# Patient Record
Sex: Male | Born: 1970 | Race: White | Hispanic: No | Marital: Married | Smoking: Never smoker
Health system: Southern US, Community
[De-identification: ages and names within clinical notes are randomized; demographics above are authoritative.]

## PROBLEM LIST (undated history)

## (undated) DIAGNOSIS — E119 Type 2 diabetes mellitus without complications: Secondary | ICD-10-CM

## (undated) DIAGNOSIS — E785 Hyperlipidemia, unspecified: Secondary | ICD-10-CM

## (undated) DIAGNOSIS — E039 Hypothyroidism, unspecified: Secondary | ICD-10-CM

## (undated) HISTORY — DX: Type 2 diabetes mellitus without complications: E11.9

## (undated) HISTORY — DX: Hyperlipidemia, unspecified: E78.5

## (undated) HISTORY — DX: Hypothyroidism, unspecified: E03.9

---

## 2005-08-29 ENCOUNTER — Inpatient Hospital Stay (HOSPITAL_COMMUNITY): Admission: EM | Admit: 2005-08-29 | Discharge: 2005-08-31 | Payer: Self-pay | Admitting: Emergency Medicine

## 2006-08-24 ENCOUNTER — Inpatient Hospital Stay (HOSPITAL_COMMUNITY): Admission: EM | Admit: 2006-08-24 | Discharge: 2006-08-26 | Payer: Self-pay | Admitting: Emergency Medicine

## 2007-08-04 ENCOUNTER — Inpatient Hospital Stay (HOSPITAL_COMMUNITY): Admission: EM | Admit: 2007-08-04 | Discharge: 2007-08-09 | Payer: Self-pay | Admitting: Emergency Medicine

## 2007-08-04 ENCOUNTER — Ambulatory Visit: Payer: Self-pay | Admitting: Cardiology

## 2008-01-11 ENCOUNTER — Emergency Department (HOSPITAL_COMMUNITY): Admission: EM | Admit: 2008-01-11 | Discharge: 2008-01-12 | Payer: Self-pay | Admitting: Emergency Medicine

## 2010-12-10 NOTE — Group Therapy Note (Signed)
[name], [name]               ACCOUNT NO.:  [id number]   MEDICAL RECORD NO.:  [id number]          PATIENT TYPE:  INP   LOCATION:  IC08                          FACILITY:  [facility]   PHYSICIAN:  Dorris Singh, DO    DATE OF BIRTH:  09/29/1970   DATE OF PROCEDURE:  08/06/2007  DATE OF DISCHARGE:                                 PROGRESS NOTE   The patient seen today resting comfortably.  Still currently is on a  dopamine drip and his blood pressure is remaining around 100.  Will go  ahead and try to titrate him down today and see how he responds.  Also,  the patient has been NPO since admission.  Will go ahead and start a  clear liquid diet and increase as tolerated.  The patient denies any  complaints.  States he feels a little bit better than he did yesterday.  Still has bouts of nausea, but is improving slowly.   VITAL SIGNS:  Heart rate 78, blood pressure 97/52, and satting 100%.  GENERAL:  This is a 40 year old Caucasian male who is well-developed,  well-nourished in no acute distress.  He is able to answer questions  appropriately.  HEART:  Regular rate and rhythm.  No murmurs noted.  LUNGS:  Clear to auscultation bilaterally.  ABDOMEN:  Soft and nontender, nondistended.  EXTREMITIES:  Positive pulses.  No ecchymosis or edema.   He had a chest x-ray done on the 8th that showed no change from previous  exam and no evidence of pulmonary edema.  His labs are as follows.  He  has a white count of 11.4, hemoglobin 11.1, hematocrit 32.4, platelet  count 218,000.  INR is .4.  His chemistries, sodium 134, potassium 3.9,  chloride 105, CO2 21, glucose 216, BUN 10, and creatinine 1.2, calcium  4.1, and his magnesium was 1.2.   ASSESSMENT AND PLAN:  1. Acute dehydration.  The patient is improving slowly.  Will continue      with fluid hydration.  2. Acute hypotension.  The patient currently on a dopamine drip.  Will      try to wean him down today, as noted above.  3. Acute renal  insufficiency.  His renal function has improved,      possibly to dehydration.  Will continue to re-hydrate him with IV      fluids.  The patient is currently on a sepsis protocol.  He has      remained stable.  Will go ahead and continue with those orders as      appropriate.  4. Diabetic ketoacidosis.  His blood sugars have been controlled with      sliding scale.  He is on a D5 1/2 normal saline drip.  Will go      ahead and switch him over to normal saline at this point in time.      Also will allow him to start to eat clear liquids and advance his      diet as tolerated.  5. Gastroenteritis.  He still has some effects of some nausea, but we  will go ahead and continue to monitor that and treat it as needed.  6. Hyperkalemia has been resolved as well.  Will continue to monitor      the patient and make changes as needed.      Dorris Singh, DO  Electronically Signed     CB/MEDQ  D:  08/06/2007  T:  08/06/2007  Job:  [id number]

## 2010-12-10 NOTE — Op Note (Signed)
NAMEKAHLEN, MORAIS               ACCOUNT NO.:  [id number]   MEDICAL RECORD NO.:  [id number]          PATIENT TYPE:  INP   LOCATION:  IC08                          FACILITY:  [facility]   PHYSICIAN:  Tilford Pillar, MD      DATE OF BIRTH:  [birth year]   DATE OF PROCEDURE:  08/04/2007  DATE OF DISCHARGE:                               OPERATIVE REPORT   PREOPERATIVE DIAGNOSIS:  Sepsis.   POSTOPERATIVE DIAGNOSIS:  Sepsis.   OPERATION PERFORMED:  Left subclavian vein triple lumen catheter  placement.   SURGEON:  Tilford Pillar, MD   ANESTHESIA:  1% lidocaine local anesthetic.   ESTIMATED BLOOD LOSS:  Minimal.   INDICATIONS FOR PROCEDURE:  The patient is a 40 year old type 1 diabetic  who presented to [facility] this morning with nausea and  vomiting and decompensation.  At this point he was admitted and  transferred to the [facility] intensive care unit with ongoing suspicion  sepsis.  Plans were __________  sepsis protocol and central venous  pressure monitoring.  The procedure was discussed with the patient.  Risks, benefits and alternatives were also discussed and the patient  consented for planned procedure.   DESCRIPTION OF PROCEDURE:  The patient was placed in Trendelenburg  position in his ICU bed at which time his left neck and chest was  prepped with chlorhexidine solution.  Sterile drapes were then placed.  Using sterile technique 1% lidocaine was injected and __________ course  of venipuncture.  An 18 gauge introducer needle was then utilized to  identify the left subclavian vein.  Good venous return was obtained.  A  J-wire was advanced in standard Seldinger technique.  A triple lumen  catheter was advanced on the wire to 20 cm.  This was sutured secure to  the skin with 2-0 silks.  All three ports flushed and aspirated without  difficulty.  At this point 20 mL of blood was aspirated from the distal  port for laboratory study and analysis.  The line was then  flushed.  A  sterile dressing was placed and Tegaderm dressing.  Drapes removed and  the patient allowed to come out of Trendelenburg position.  Sharps were  disposed of __________  at this point stat portable chest x-ray was  ordered.      Tilford Pillar, MD  Electronically Signed     BZ/MEDQ  D:  08/04/2007  T:  08/05/2007  Job:  [id number]

## 2010-12-10 NOTE — Consult Note (Signed)
[name]               ACCOUNT NO.:  [id number]   MEDICAL RECORD NO.:  [id number]          PATIENT TYPE:  INP   LOCATION:  A201                          FACILITY:  [facility]   PHYSICIAN:  Gerrit Friends. Dietrich Pates, MD, FACCDATE OF BIRTH:  [birth year]   DATE OF CONSULTATION:  08/09/2007  DATE OF DISCHARGE:  08/09/2007                                 CONSULTATION   HISTORY OF PRESENT ILLNESS:  A 40 year old gentleman with diabetic  ketoacidosis referred for evaluation of hypotension and EKG  abnormalities.  [name] has no prior history of cardiac disorders.  He has never been seen by a cardiologist nor undergone any significant  cardiac testing.  He was diagnosed with diabetes approximately seven  years ago, and has only had one previous episode of DKA.  He was  admitted to hospital on the 7th with hypotension thought to perhaps  represent septic shock and DKA.  He has responded well to initial  therapy.  Blood pressure improved with fluid resuscitation.  He has had  no chest pain nor dyspnea.  Initial ST segment elevation on EKG has  resolved.   PAST MEDICAL HISTORY:  Otherwise notable for a history of Crohn's  disease.  He is not terribly diligent about his medical care in general.  He has been seen at [facility] for GI symptoms consistent with  gastroenteritis on two occasions over the past two years.   HOME MEDICATIONS:  Home medications consist only of insulin.   ALLERGIES:  He has no known allergies.   FAMILY HISTORY:  Positive for hypertension and coronary disease as well  as diabetes.   SOCIAL HISTORY:  Single; lives with a girlfriend; no use of tobacco  products; no significant alcohol consumption; no illicit drugs.   REVIEW OF SYSTEMS:  Notable for stable weight and appetite, no  exertional symptoms, occasional constipation and nausea.  All other  systems reviewed and are negative.   PHYSICAL EXAMINATION:  GENERAL:  Pleasant robust-appearing gentleman in  no acute  distress.  VITAL SIGNS:  Blood pressure 85/55 without orthostasis and without  symptoms, afebrile, heart rate 75 and regular, respirations 20, O2  saturation 95% on room air, CBG 88.  HEENT:  Anicteric sclerae; normal lids and conjunctivae; normal oral  mucosa.  NECK:  No jugular venous distention; normal carotid upstrokes without  bruits.  ENDOCRINE:  No thyromegaly.  SKIN:  No significant lesions.  LUNGS:  Clear.  CARDIAC:  Normal first and second heart sounds; normal PMI.  ABDOMEN:  Soft and nontender; normal bowel sounds; no organomegaly.  EXTREMITIES:  Normal distal pulses; no edema.  NEUROLOGIC:  Alert and oriented; normal affect; normal cranial nerves;  symmetric strength and tone.   LABORATORY DATA:  Notable for potassium 6.7 on admission which  subsequently normalized.  Magnesium 1.7.  Hematocrit 30.6.  Bicarbonate  17, creatinine was 2.15, but has normalized.  CPK was 2280 with an MB of  8 suggesting a muscle source as well as the normal troponin.  BNP was  64.   Chest x-ray showed mild cardiomegaly.  Cultures have been negative.  EKG  initially showed ST segment elevation in lead V2 which persisted for two  days, but has now resolved.   IMPRESSION:  [name] shows isolated EKG abnormalities in lead V2  without symptoms to suggest a myocardial ischemia, but in the setting of  severe acute illness with hypotension his risk of coronary disease is  certainly above that of a same age cohort due to his insulin-requiring  diabetes.  A lipid profile will be assessed.Marland Kitchen  He need not stay in the  hospital.  We will check an echocardiogram, and proceed with additional  testing if negative.   I appreciate the request for consultation, and will be happy to assist  in this nice gentleman's care.      Gerrit Friends. Dietrich Pates, MD, [facility]  Electronically Signed     RMR/MEDQ  D:  08/18/2007  T:  08/18/2007  Job:  [id number]   cc:   Margaretmary Dys, M.D.

## 2010-12-10 NOTE — H&P (Signed)
NAMEJEFFEY, [name]               ACCOUNT NO.:  [id number]   MEDICAL RECORD NO.:  [id number]          PATIENT TYPE:  INP   LOCATION:  IC08                          FACILITY:  [facility]   PHYSICIAN:  Margaretmary Dys, M.D.DATE OF BIRTH:  Dec 06, 1970   DATE OF ADMISSION:  08/03/2006  DATE OF DISCHARGE:  LH                              HISTORY & PHYSICAL   PRIMARY CARE PHYSICIAN:  Corrie Mckusick, M.D.   ADDITIONAL DIAGNOSES:  1. Acute gastroenteritis.  2. Severe dehydration.  3. Acute renal insufficiency.  4. Poorly controlled type 1 diabetes noticed.  5. Severe hyperkalemia, likely secondary to acute renal      failure/dehydration as mentioned above.   CHIEF COMPLAINT:  Nausea, vomiting and diarrhea of two days duration.   HISTORY OF PRESENT ILLNESS:  [name] is a 40 year old male who has  type 1 diabetes who has had it for about seven years now.  The patient  does not have a regular primary care physician, but in the past has been  seen by Dr. Phillips Odor on admission.   The patient noted that he developed nausea, vomiting and diarrhea with  some abdominal pain over the past couple of days.  Gives a similar  history in the girlfriend's father.  He denies any fever or chills.  No  myalgia.  He has had no headaches.   The patient is unable to keep any fluids down.   He does not really monitor his blood sugars, but gives himself insulin  depending on how he feels.   He does not have a fixed stools.   On arrival in the ER, he was noted to be hypotensive.   His blood sugars were noted to be in the 360s.  His blood work revealed  that he was hyperkalemic.  The patient received several boluses of fluid  and also some calcium chloride.  Kayexalate was put on hold due to his  diarrhea.   After several fluid boluses and the medications as mentioned above, the  patient's potassium is now improved at 4.4.  I admitted him to the  intensive care unit, and his blood pressure has  improved.  He is  receiving additional boluses of fluids now.   The patient's blood sugars have also decreased to 103 on repeat basic  metabolic panel.   REVIEW OF SYSTEMS:  The patient denies any weight loss.  No blood in the  stools.  He denies any hematemesis.  He denies any chest pain.   PAST MEDICAL HISTORY:  1. Diabetes mellitus.  2. History of Crohn's disease.  3. History of diabetic ketoacidosis in the past.  4. Likely history of noncompliant diabetes.  5. History of gastroenteritis January 2008 and also in February 2007.   MEDICATIONS:  Insulin on a sliding scale with no fixed dose.   ALLERGIES:  Denies any known drug allergies.   FAMILY HISTORY:  Positive for hypertension, coronary artery disease,  diabetes.   SOCIAL HISTORY:  The patient is single, does have a girlfriend, does not  smoke, neither does he drink alcohol.  He denies any illicit  drug use.  The patient's girlfriend's father has a similar illness of nausea,  vomiting and diarrhea.  He suspects this is probably a  virus.   PHYSICAL EXAMINATION:  GENERAL:  The patient was conscious, alert,  lethargic, not in acute distress.  VITAL SIGNS:  Blood pressure is now 103/77, pulse 115, respirations 16,  temperature 98.5, oxygen saturation is 100% on room air.  HEENT:  Normocephalic, atraumatic.  Oral mucosa was dry.  No exudates  were noted.  NECK:  Supple. No JVD or lymphadenopathy.  LUNGS:  Clear clinically with good air entry bilaterally.  HEART:  S1, S2 regular.  No S3, S4, gallops or rubs.  ABDOMEN:  Soft, nontender, bowel sounds positive.  No masses palpable.  EXTREMITIES:  No pedal edema.  No calf induration or tenderness was  noted.   STUDIES:  CNS exam was grossly intact with no focal neurological  deficits.   LABORATORY/DIAGNOSTIC DATA:  Blood gas obtained on room air showed a pH  of 7.492, PCO2 24.8, PO2 78, bicarbonate 18.8, saturation 97%.   White blood cell count 10.8, hemoglobin 15.1,  hematocrit 45, platelets  275 with no left shift.  Initial basic metabolic panel:  Sodium 128,  potassium 6.7.  This is improved to 4.4.  Chloride was 95, CO2 24,  glucose 316 now down to 103.  BUN 31, creatinine 2.15.   Initial cardiac markers showed a creatinine kinase of 1200.   Urine drug screen was positive for opiates and alcohol was less than 5.  Urinalysis was negative.   A chest x-ray was normal with no acute infiltrates noted.   ASSESSMENT/PLAN:  [name] is a 40 year old type 1 diabetic with poor  compliance who presented to the emergency room with acute onset of  nausea, vomiting, abdominal pain and diarrhea.  Features symptoms  consistent with acute gastroenteritis.   DIAGNOSES:  1. Acute gastroenteritis.  2. Severe dehydration.  3. Acute renal failure, likely secondary to prerenal azotemia.  4. Hypotension, again, likely related to dehydration.  5. Poorly controlled type I diabetes.   PLAN:  1. Pulmonary.  The patient is doing fairly well.  I do not see any      indication for mechanical intervention at this time.  His blood gas      is satisfactory in that we have some alkalosis which may be to some      degree of contraction alkalosis.  Plan to continue oxygen therapy      as indicated.  2. Renal.  The patient has acute renal insufficiency.  His creatinine      is already slightly improved after receiving boluses of fluids.  We      are going to continue him with fluid boluses at this time.  3. Fluid electrolytes and nutrition.  The patient will be placed on      clear liquids as needed.  He will also receive some Zofran and      Phenergan for nausea and vomiting.  The patient has received some      calcium, chloride and fluid therapy, and his potassium has      improved.  We will again repeat at 12 noon today.   We will avoid giving him any Kayexalate as the patient already has  significant diarrhea.   It is noted that a 12-lead EKG was noted to show peak  T waves, likely  secondary to his hyperkalemia.  This has improved with his potassium now  down to  4.4.  1. Cardiovascular.  The patient is hypotensive, likely secondary to      dehydration and prerenal azotemia.  We will give him multiple fluid      boluses and will continue him on normal saline at 300 cc an hour.      I did not see any indication for pressors at this time.  If he      remains refractory hypotensive, we will start him on some Levophed.  2. Gastrointestinal.  The patient has acute gastroenteritis.  Will put      him on clear liquids.  Abdomen is benign at this time.  The patient      said he used to have Crohn's disease, but not any more.  We will      watch carefully to see if this is an exacerbation of his Crohn's.      His abdominal exam was unremarkable and is fairly benign.  3. Central Nervous System.  CNS exam was unremarkable.  The patient      had no further neurological deficits, although he is lethargic.  It      is more likely that he has viral gastroenteritis.  4. Prophylaxis.  The patient will be on Protonix for GI prophylaxis      and Lovenox for DVT prophylaxis.  5. Pain control will be with morphine p.r.n.   DISPOSITION:  I discussed above plan with the patient and verbalized  full understanding.   CRITICAL CARE TIME:  Total critical care of this patient spent in the  care of this procedure, excluding procedures, was forty minutes.      Margaretmary Dys, M.D.  Electronically Signed     AM/MEDQ  D:  08/04/2007  T:  08/04/2007  Job:  [id number]   cc:   Corrie Mckusick, M.D.  Fax: [phone]

## 2010-12-10 NOTE — Group Therapy Note (Signed)
NAMEELGIE, [name]               ACCOUNT NO.:  [id number]   MEDICAL RECORD NO.:  [id number]          PATIENT TYPE:  INP   LOCATION:  IC08                          FACILITY:  [facility]   PHYSICIAN:  Dorris Singh, DO    DATE OF BIRTH:  01-14-1971   DATE OF PROCEDURE:  08/07/2007  DATE OF DISCHARGE:                                 PROGRESS NOTE   The patient is seen today looking better.  He has weaned off the  dopamine drip and has been maintaining blood pressure with systolic  pressure in the 80's which he says is typical for him.  We will go ahead  and monitor him in the ICU for 1 more night.  As long as he remains  stable we will discharge him to 2A and then continue to monitor him with  a possible discharge 1-2 days after that.   PHYSICAL EXAMINATION:  VITAL SIGNS:  His vital signs are as follows:  Heart rate 87, blood pressure __________ , pulse ox __________ %.  GENERAL:  This is a 40 year old male who is well-developed, well-  nourished and in no acute distress.  HEART:  Regular rate and rhythm. .  LUNGS:  __________  with few bibasilar wheezes.  ABDOMEN:  Soft, nontender, nondistended.  EXTREMITIES:  No edema.   His labs are as follows:  CBC with a white count of 5.4, hemoglobin  10.9, hematocrit of 31.9 and platelet count of 242.  Chemistry 136 for  sodium, potassium 3.6, chloride 104, CO2 27, glucose 82, BUN 2,  creatinine 0.80.   ASSESSMENT:  1. Acute dehydration which is improved.  2. Acute hypotension.  Currently the patient has been weaned off      dopamine drip.  We will continue to monitor him and change therapy      as needed.  3. Acute renal insufficiency which has improved and it is  resolved.  4. Diabetic ketoacidosis which has been under control and resolved.   PLAN:  The plan is to monitor him for 24 more hours and as long as he is  stable we will go ahead and move him to the stepdown unit.      Dorris Singh, DO  Electronically Signed     CB/MEDQ  D:  08/07/2007  T:  08/07/2007  Job:  [id number]

## 2010-12-10 NOTE — Group Therapy Note (Signed)
NAMEBLAKELY, GLUTH               ACCOUNT NO.:  [id number]   MEDICAL RECORD NO.:  [id number]          PATIENT TYPE:  INP   LOCATION:  IC08                          FACILITY:  [facility]   PHYSICIAN:  Dorris Singh, DO    DATE OF BIRTH:  May 17, 1971   DATE OF PROCEDURE:  08/05/2007  DATE OF DISCHARGE:                                 PROGRESS NOTE   Patient seen today resting comfortably in the ICU.  He feels a little  bit better than he did yesterday.  They tried to place a central line  last night but the patient refused after unsuccessful attempts.  The  patient continues to improve without any problems.  He was monitored by  the [facility] physician.  Also there was some concern about his renal  function, which seems to be improving since yesterday.  They instituted  the sepsis protocol and cultured him and started him on Levaquin as  well.  So far the patient is doing well.   His vitals are as follows:  Heart rate is 107, his pulse oximetry is  98%.  Blood pressures are reading 100s over 60s.  He is on a dopamine drip at 800 mg and that is continuing.  The patient  also is on a sliding scale for his coverage and currently is on Levaquin  750 mg q.24h.  Those are his drips.  GENERAL:  This is a 40 year old male who is well-developed, well-  nourished, in no acute distress.  He answers questions appropriately.  HEART:  Regular rate and rhythm.  No murmurs noted.  S1, S2.  LUNGS:  Clear to auscultation bilaterally.   The patient had a portable chest x-ray with no change from prior exam,  no evidence of pulmonary edema.  His current labs are as follows:  His  total hemoglobin saturation is still low but his oxygen saturation has  improved.  His white count is 11.4, hemoglobin 11.1, hematocrit 32.4,  and a platelet count of 218.  Chemistry:  Sodium 134, potassium 3.9,  chloride 105, CO2 21, glucose 260, BUN 20, and creatinine 1.2.  This has  improved over the last 24 hours.  His magnesium is  1.2.   ASSESSMENT AND PLAN:  1. Acute dehydration.  2. Acute renal insufficiency.  3. Diabetic ketoacidosis.  4. Severe hyperkalemia due to acute renal insufficiency and acute      gastroenteritis.  5. Gastroenteritis.   PLAN:  We will continue with IV hydration.  Acute renal failure is  improving.  His diabetes has been under control.  We are also seeing a  resolution of his hyperkalemia.  Currently he has hypomagnesemia.  We  will go ahead and replace that with p.o. supplementation.  Apparently  too, he has hyponatremia but that is resolving as well.  We will  continue to monitor that and see how the patient improves.      Dorris Singh, DO  Electronically Signed     CB/MEDQ  D:  08/05/2007  T:  08/05/2007  Job:  [id number]

## 2010-12-10 NOTE — Consult Note (Signed)
NAMEJERARD, BAYS               ACCOUNT NO.:  [id number]   MEDICAL RECORD NO.:  [id number]          PATIENT TYPE:  INP   LOCATION:  IC08                          FACILITY:  [facility]   PHYSICIAN:  Tilford Pillar, MD      DATE OF BIRTH:  01-17-1971   DATE OF CONSULTATION:  08/04/2007  DATE OF DISCHARGE:                                 CONSULTATION   REASON FOR CONSULTATION:  1. Sepsis.  2. Diabetic DKA requiring IV access and central venous monitoring.   HISTORY OF PRESENT ILLNESS:  The patient is an unfortunate 40 year old  male with a history of type 1 diabetes mellitus.  He presented to [facility] early this morning with a prolonged course of nausea and  vomiting.  During initial evaluation he was suspected to be dehydrated  and in DKA.  As the day has progressed, his picture has turned more  towards a septic picture.  Based on this and based on his continued  fluid requirements, and requirements for pressor medications, it was  advised for placement of central venous catheter for access as well as  for central venous pressure monitoring.   PAST MEDICAL HISTORY:  Diabetes type 1.  Reviewing his chart, no  evidence of any bleeding diatheses or contraindications for central line  placement.   CURRENT MEDICATIONS:  The patient is currently not on any  anticoagulation.  He is on prophylactic subcutaneous DVT prophylaxis,  but otherwise is not on any medications which would contraindicate  central line placement.   PHYSICAL EXAMINATION:  The patient is a young male and does not appear  to be in any acute distress.  He is somewhat somnolent, lying in a  supine position in his intensive care unit bed.  He does respond  appropriately to questions and follows commands appropriately.  Evaluation of his neck reveals a midline trachea.  No cervical  lymphadenopathy.  No JVD is appreciated.  His clavicles are symmetrical  bilaterally.  No scars or deformities suggestive of prior  shoulder or  upper chest operations.   ASSESSMENT AND PLAN:  1. Sepsis.  2. Diabetic ketoacidosis requiring central venous pressure monitoring.   At this point both the risks, benefits, and alternatives were discussed  with the patient of placing a central venous catheter.  The plan was  likely attempt at the left subclavian vein due to the patient's body  habitus.  He does have a relatively large neck.  I feel that he has  __________ .      Tilford Pillar, MD  Electronically Signed     BZ/MEDQ  D:  08/04/2007  T:  08/04/2007  Job:  [id number]   cc:   Cherylann Parr

## 2010-12-10 NOTE — Discharge Summary (Signed)
[name], [name]               ACCOUNT NO.:  [id number]   MEDICAL RECORD NO.:  [id number]          PATIENT TYPE:  INP   LOCATION:  A201                          FACILITY:  [facility]   PHYSICIAN:  Dorris Singh, DO    DATE OF BIRTH:  April 14, 1971   DATE OF ADMISSION:  08/03/2007  DATE OF DISCHARGE:  01/12/2009LH                               DISCHARGE SUMMARY   ADMISSION DIAGNOSES:  1. Acute gastritis.  2. Severe dehydration.  3. Acute renal failure.  4. Hypotension.  5. Severe hyperkalemia.  6. Poorly controlled diabetes type 1.   HISTORY OF PRESENT ILLNESS:  The patient's H&P was done by Margaretmary Dys, M.D., please refer to that.   HOSPITAL COURSE:  He was admitted to the ICU.  Just to summarize, the  patient is a 40 year old type I diabetic with poor compliance who  presented to the emergency room with onset of nausea, vomiting,  abdominal pain, and diarrhea which was consistent with gastroenteritis.  Also he was found to have elevated potassium as well as being in renal  failure.  He was then admitted to the ICU and given up to 6 liters of  bolus fluids.  He continued to remain hypotensive at that point in time.  He was also given these fluids and that actually helped to correct his  potassium bringing it down to a normal level.  He was placed on GI and  DVT prophylaxis.  While he was in the ICU he started to become  hypotensive and he was placed on a dopamine drip to keep his blood  pressure elevated.  Also there was some concern with his history of  gastroenteritis, I started him on some antibiotic therapy.  [facility] doctor  spoke with me, Kalman Shan, M.D., regarding his condition on  January 7 and wanted to institute the sepsis protocol which was then  instituted.  They attempted a central line but could not do it and the  patient refused any further treatment regarding a central line.  He  continued to progress but still remained hypotensive on a dopamine drip  with up to 8 mcg.  On January 8, he had no growth of any of his blood  cultures.  Also his renal insufficiency was resolving and his  hyperkalemia was resolving as well.  The patient also had  hypomagnesemia.  This was replaced and this resolved.  He continued to  improve slowly with hydration.  We still kept him on a dopamine drip.  His renal failure corrected and he remained stable and we placed him on  a sliding scale for his sugars and had diabetic teaching to see him.  He  improved slowly and we were able to keep his blood sugars under control.  On January 11, his acute hypotension had resolved.  We were able to wean  him off of the drip and he maintained a stable pressure in the 80/50  without any symptoms.  His acute dehydration was resolved.  His renal  insufficiency therefore was resolved.  His diabetic ketoacidosis was  resolved and we started him on  the 10 units of Lantus q.h.s., kept him  on the antibiotics January 12.  At that point in time it was determined  that the patient could be transferred out of the ICU.  Also during his  time he had tented T waves because of his hyperkalemia but repeated his  EKG once his potassium had resolved.  I did consult cardiology regarding  his.  Today they saw him and went ahead and did a two-dimensional  echocardiogram and a verbal report showed that it was within normal  limits.  We will go ahead and discharge the patient home.  His condition  is stable.   PLAN:  Set him up with Dr. Margo Aye for follow-up.   DISCHARGE MEDICATIONS:  1. Lantus 10 units subcu daily.  2. Levaquin 500 mg one p.o. daily x5 days.  3. Phenergan 25 mg take 1/2 tablet every 6 hours p.r.n. nausea.   FOLLOWUP:  He will follow up with Dr. Margo Aye in 1-3 days.  Also he will  see [facility] for echo results in 3-5 days.  The patient was  told to return if symptoms worsen and his condition is stable and his  disposition is to home.      Dorris Singh, DO   Electronically Signed     CB/MEDQ  D:  08/09/2007  T:  08/09/2007  Job:  [id number]

## 2010-12-10 NOTE — Group Therapy Note (Signed)
[name], [name]               ACCOUNT NO.:  [id number]   MEDICAL RECORD NO.:  [id number]          PATIENT TYPE:  INP   LOCATION:  IC08                          FACILITY:  [facility]   PHYSICIAN:  Dorris Singh, DO    DATE OF BIRTH:  [birth year]   DATE OF PROCEDURE:  08/08/2007  DATE OF DISCHARGE:                                 PROGRESS NOTE   Patient seen today.  Blood pressure stabilized.  He is maintaining  80s/50s without any symptoms and stated that his blood pressure normally  runs low.  He had an uneventful night tonight.  Will go ahead and  transfer him to step-down 2A and anticipate discharge in one to two  days.   PHYSICAL EXAMINATION:  VITAL SIGNS:  Blood pressure 84/52, heart rate 89  and pulse oximeter 100%.  GENERAL:  He is alert and oriented.  HEART:  Regular rate and rhythm.  LUNGS:  Clear to auscultation bilaterally.  ABDOMEN:  Soft, nontender, nondistended.  EXTREMITIES:  Positive pulses in all extremities.  No ecchymosis,  cyanosis, or edema.   LABORATORY DATA:  White count 4.2, hemoglobin 10.5, hematocrit 30.6 and  platelet count 232.  Sodium 134, potassium 3.9, chloride 105, CO2 24,  glucose 197, BUN 8, creatinine 0.79.   ASSESSMENT/PLAN:  1. Acute hypotension which has resolved.  Will continue to monitor      him.  2. Acute dehydration, resolved.  3. Acute renal insufficiency, resolved.  4. Diabetic ketoacidosis, resolved.  5. Diabetes.  Will go head and start him on Lantus 10 units tonight      and will send him home on that with a prescription.  6. Will also continue with his IV antibiotics today and, as long as he      continues to improve, we will switch him to p.o. antibiotics      tomorrow and look at discharge if not within 24 hours, within 48.      Dorris Singh, DO  Electronically Signed     CB/MEDQ  D:  08/08/2007  T:  08/08/2007  Job:  [id number]

## 2010-12-13 NOTE — Procedures (Signed)
NAMEJHOAN, SCHMIEDER               ACCOUNT NO.:  [id number]   MEDICAL RECORD NO.:  [id number]          PATIENT TYPE:  INP   LOCATION:  A201                          FACILITY:  [facility]   PHYSICIAN:  Gerrit Friends. Dietrich Pates, MD, FACCDATE OF BIRTH:  1970/11/01   DATE OF PROCEDURE:  DATE OF DISCHARGE:                                ECHOCARDIOGRAM   REFERRING PHYSICIAN:  1. Dr. Lilian Kapur.  2. Dr. Dietrich Pates.   CLINICAL DATA:  A 40 year old gentleman with diabetic ketoacidosis,  hypotension and ST-segment elevation in the septal leads.  M-mode aorta  2.7, left atrium 3.1, septum 0.9, posterior wall 1.0, LV diastole 4.0,  LV systole 3.0.   ASSESSMENT:  1. Technically adequate echocardiographic study.  2. Normal left atrium, right atrium and right ventricle.  3. Normal tricuspid valve with physiologic regurgitation and normal      estimated RV systolic pressure.  4. Normal aortic valve.  5. Borderline thickening of the mitral valve with borderline prolapse,      but no regurgitation.  6. Normal pulmonic valve and proximal pulmonary artery.  7. Normal internal dimension, wall thickness, regional and global      function of the left ventricle.  8. Normal IVC.  9. Minimal pericardial effusion near the apex.      Gerrit Friends. Dietrich Pates, MD, [facility]  Electronically Signed     RMR/MEDQ  D:  08/11/2007  T:  08/11/2007  Job:  [id number]

## 2010-12-13 NOTE — Discharge Summary (Signed)
[name], [name]               ACCOUNT NO.:  [id number]   MEDICAL RECORD NO.:  [id number]          PATIENT TYPE:  INP   LOCATION:  A216                          FACILITY:  [facility]   PHYSICIAN:  Patrica Duel, M.D.    DATE OF BIRTH:  [birth year]   DATE OF ADMISSION:  08/29/2005  DATE OF DISCHARGE:  02/04/2007LH                                 DISCHARGE SUMMARY   DISCHARGE DIAGNOSES:  Hyperglycemia, mild metabolic acidosis and insulin-  dependent diabetic following episode of apparently gastroenteritis,  resolved.   HISTORY OF PRESENT ILLNESS:  For details regarding admission, please refer  to the admitting note.  Briefly, this is a 40 year old male with a long-  standing history of insulin-dependent diabetes mellitus who presented to the  emergency department the day of admission with intractable nausea, vomiting  and weakness.  He had been somewhat ill for approximately 48 hours.  He  experienced some sore throat, but his major problem was nausea and vomiting  at approximately 36 hours duration.  He became increasingly weak and  obviously dehydrated and was brought to the emergency department for  evaluation.   In the emergency department, the patient was found to have a normal hemogram  except for mild __________cystosis.  Met-7 revealed sodium 126, potassium  4.0, chloride 92, CO2 22, glucose 422.  Blood gases revealed mild metabolic  acidosis with pH of 1.61.   The patient was admitted with hyperglycemia, dehydration, metabolic acidosis  as noted.   HOSPITAL COURSE:  The patient was aggressively hydrated and treated with  broad-spectrum antibiotics.  His strep and mono screens were both negative.  He promptly improved.  His GI symptoms resolved.  His glucose and lytes  normalized, and he was stable for discharge on hospital day #2.   DISCHARGE MEDICATIONS:  Insulin Novolin R 8 units b.i.d. subcu and Novolin N  8 units b.i.d.   FOLLOWUP:  He will be followed and treated  expectantly.      Patrica Duel, M.D.  Electronically Signed     MC/MEDQ  D:  09/21/2005  T:  09/22/2005  Job:  [id number]

## 2010-12-13 NOTE — H&P (Signed)
NAMELUISFERNANDO, BRIGHTWELL               ACCOUNT NO.:  [id number]   MEDICAL RECORD NO.:  [id number]          PATIENT TYPE:  INP   LOCATION:  IC06                          FACILITY:  [facility]   PHYSICIAN:  Corrie Mckusick, M.D.  DATE OF BIRTH:  Dec 24, 1970   DATE OF ADMISSION:  08/23/2006  DATE OF DISCHARGE:  LH                              HISTORY & PHYSICAL   ADMISSION DIAGNOSIS:  Mild diabetic ketoacidosis, gastroenteritis.   HISTORY OF PRESENTING ILLNESS:  A 40 year old type 1 diabetic who  presents with 2-3 days of nausea, vomiting, and just mild diarrhea.  It  appears classically that he had a gastrointestinal disease and has not  taken his insulin in nearly 72 hours.  He took some of the regular  insulin the day prior to admission but again his blood sugars have been  going in the 200 and now 300 range.   In the emergency department he was found to be quite dehydrated and was  aggressively fluid-resuscitated.  He was hypotensive initially in the ER  and this seemed to improve.  He has been maintained priorly on low-dose  insulin.  I had a good discussion with him about insulin pumps but he  said right now he cannot afford that due to the co-pay required with his  insurance.   A blood gas was not drawn in the emergency department but his bicarb was  initially 18.  He really could not have a significant anion gap.  I felt  like he was more just dehydrated with just some mild ketoacidosis.   PAST MEDICAL HISTORY:  1. Type 1 diabetic for, now, 6 years.  2. Crohn's disease.   MEDICATIONS ON ADMISSION:  Novolin N 7-8 units in the morning and the  evening and sliding scale regular insulin.   ALLERGIES:  None.   FAMILY HISTORY:  Noncontributory.   SOCIAL HISTORY:  Nonsmoker, nondrinker.   PHYSICAL EXAMINATION ON ADMISSION:  VITAL SIGNS:  Afebrile at 98.0,  blood pressure initially was 98/60, heart rate in the 80s, respiratory  rate 18.  GENERAL:  When I saw the patient he was  pleasant, talkative, felt quite  a bit better.  HEENT:  Nose and oropharynx clear with slightly dry mucous membranes.  NECK:  Supple.  CHEST:  Clear to auscultation bilaterally.  CARDIOVASCULAR:  Regular rate and rhythm; normal S1 and S2.  ABDOMEN:  Soft, nontender, nondistended.  EXTREMITIES:  No edema.   LABORATORY:  Please see flow sheet for details.   ASSESSMENT AND PLAN:  A 40 year old gentleman with gastroenteritis,  dehydration, and mild diabetic ketoacidosis.   PLAN:  1. Put him in the unit overnight in the event that he would become      hypokalemic and the ketoacidosis was worse than it was.  2. Glucommander used aggressively.  3. Aggressive hydration.  4. Aggressive potassium replacement.  5. Will work with hopefully getting him an insulin pump after      discharge.      Corrie Mckusick, M.D.  Electronically Signed     JCG/MEDQ  D:  08/24/2006  T:  08/24/2006  Job:  [id number]

## 2010-12-13 NOTE — H&P (Signed)
NAME:  [name], [name]               ACCOUNT NO.:  [id number]   MEDICAL RECORD NO.:  [id number]          PATIENT TYPE:  INP   LOCATION:  A216                          FACILITY:  [facility]   PHYSICIAN:  Patrica Duel, M.D.    DATE OF BIRTH:  [birth year]   DATE OF ADMISSION:  08/29/2005  DATE OF DISCHARGE:  LH                                HISTORY & PHYSICAL   CHIEF COMPLAINT:  Weakness, nausea, vomiting.   HISTORY OF PRESENT ILLNESS:  This is a 40 year old male with a longstanding  history of insulin-dependent-diabetes mellitus.  He is seen infrequently in  the office.  He is maintained on relatively low doses of insulin as noted  below.  Other than that the patient is devoid of significant past history.  He seems compliant and is relatively well controlled.   The patient presented to the emergency department today with intractable  nausea, vomiting, and weakness.  Apparently, the patient's present illness  began with the sore throat approximately 48 hours ago.  He had some fever  at onset.  His throat has remained somewhat sore but he began to experience  nausea and vomiting 36 hours ago.  He stopped his insulin due to his  decreased p.o. intake.  He became increasingly weak and obviously  dehydrated.  He was brought to the emergency room for evaluation.   In the ER, the patient was found to have essentially normal hemogram, except  for a mild monocytosis.  MET-7 revealed a sodium of 126, potassium 4.0,  chloride 92, carbon dioxide of 22, glucose of 432, BUN of 16, and creatinine  1.4, calcium 8.5.  Arterial blood gas showed a mild metabolic acidosis with  a pH of 7.31, pCO2 at 37.3, and a pO2 of 125, a calculated bicarb 18.5.  Abdominal x-rays unremarkable per ER physician.   The patient is admitted with hyperglycemia, dehydration, mild metabolic  acidosis probably related to pharyngitis possibly mononucleosis.  Other  etiologies to be determined.   There is no history of significant  headache, neurologic deficits, melena,  hematemesis, hematochezia, or genitourinary symptoms.  He also denies chest  pain, shortness of breath.  He also denies abdominal pain at the present  time.   CURRENT MEDICATIONS:  1.  Novolin N insulin, 7 or 8 units q.a.m. and q.p.m.  2.  Novolin R insulin, 7 to 8 units b.i.d.   ALLERGIES:  None known.   PAST HISTORY:  As noted.  He also has Crohn disease which has been  quiescent.   FAMILY HISTORY:  Noncontributory.   SOCIAL HISTORY:  Nonsmoker, nondrinker.   REVIEW OF SYSTEMS:  Negative except as mentioned.   PHYSICAL EXAMINATION:  GENERAL:  This is a very pleasant male who is alert  and oriented, though mildly lethargic.  VITAL SIGNS:  Temp 98 degrees, BP 113/62, pulse 94 regular, respirations 28  and unlabored, repeat respiratory rate 20.  O2 sat is 100%.  HEENT:  Normocephalic and atraumatic.  The mucous membranes are dry.  The  throat is moderately injected.  There is some exudate present on the uvula,  however, the tonsils and tonsillar pillars are free of exudate.  NECK:  Supple.  No significant adenopathy noted.  LUNGS:  Clear to A&P.  HEART:  Sounds are normal without murmurs, rubs, or gallops.  ABDOMEN:  Scaphoid.  It is nontender, nondistended.  Bowel sounds are within  normal limits.  GENITOURINARY:  Normal.  EXTREMITIES:  No clubbing, cyanosis, edema.   ASSESSMENT:  Hyperglycemia and mild metabolic acidosis probably precipitated  by viral infection, though streptococcal pharyngitis remains a possibility,  also mononucleosis given mild monocytosis on CBC.   PLAN:  1.  Admit for hydration and close monitoring of electrolyte status,      etcetera.  2.  We will check a rapid strep and administer Rocephin empirically, pending      his progress.  3.  We will follow and treat expectantly.      Patrica Duel, M.D.  Electronically Signed     MC/MEDQ  D:  08/29/2005  T:  08/29/2005  Job:  [id number]

## 2010-12-13 NOTE — Discharge Summary (Signed)
[name], [name]               ACCOUNT NO.:  [id number]   MEDICAL RECORD NO.:  [id number]          PATIENT TYPE:  INP   LOCATION:  A209                          FACILITY:  [facility]   PHYSICIAN:  Corrie Mckusick, M.D.  DATE OF BIRTH:  Dec 17, 1970   DATE OF ADMISSION:  08/23/2006  DATE OF DISCHARGE:  01/30/2008LH                               DISCHARGE SUMMARY   DISCHARGE DIAGNOSES:  Mild diabetic ketoacidosis and gastroenteritis.   HISTORY OF PRESENT ILLNESS:  Please see the admission H&P.   PAST MEDICAL HISTORY:  Please see the admission H&P.   HOSPITAL COURSE:  This 40 year old gentleman with gastroenteritis,  dehydration and mild DKA, who was admitted to the unit, having  progressive hydration and potassium replacement.  Glucommander was used  overnight.  The patient improved greatly by the morning, with the blood  sugars down in the 150's.  The initial blood sugars were in the 300's.  He has been non-compliant with following up in the office.  As a matter  of fact, within the last two years he has only been in the office twice  and we have strongly encouraged him that he must take his diabetes  seriously and come to the office to see the doctor more often.  He has  improved each day here in the hospital.  We on the day after admission  decided to use Lantus once a day, to see if we could work on his  compliance, using then __________ sliding scale insulin.  He has done  well with this with blood sugars being well controlled.   He was ready for discharge on August 26, 2006.  All GI symptoms have  resolved.   DISCHARGE PHYSICAL EXAMINATION:  Please see Dr. Loraine Leriche Cresenzo's note on  the day of discharge.   DISCHARGE MEDICATIONS:  1. Lantus 12 units subcu q.p.m.  2. Sliding scale Regular insulin as directed.   FOLLOWUP:  He is going to follow up with Dr. Nobie Putnam in one week.   CONDITION ON DISCHARGE:  Improved and stable.      Corrie Mckusick, M.D.  Electronically  Signed     JCG/MEDQ  D:  08/26/2006  T:  08/26/2006  Job:  [id number]

## 2011-04-17 LAB — DIFFERENTIAL
Basophils Absolute: 0
Basophils Absolute: 0
Basophils Absolute: 0
Basophils Absolute: 0
Basophils Absolute: 0
Basophils Absolute: 0
Basophils Relative: 0
Eosinophils Absolute: 0
Eosinophils Absolute: 0
Eosinophils Absolute: 0.1
Eosinophils Relative: 0
Eosinophils Relative: 0
Eosinophils Relative: 5
Eosinophils Relative: 5
Lymphocytes Relative: 15
Lymphocytes Relative: 39
Lymphocytes Relative: 42
Lymphocytes Relative: 48 — ABNORMAL HIGH
Lymphs Abs: 2.1
Lymphs Abs: 2.1
Monocytes Absolute: 0.3
Monocytes Absolute: 0.4
Monocytes Absolute: 0.5
Monocytes Absolute: 0.9
Monocytes Relative: 8
Monocytes Relative: 8
Neutro Abs: 1.8
Neutro Abs: 1.9
Neutro Abs: 10.8 — ABNORMAL HIGH
Neutro Abs: 2.5
Neutrophils Relative %: 72

## 2011-04-17 LAB — CBC
HCT: 30.2 — ABNORMAL LOW
HCT: 30.6 — ABNORMAL LOW
HCT: 31.9 — ABNORMAL LOW
Hemoglobin: 10.4 — ABNORMAL LOW
Hemoglobin: 10.5 — ABNORMAL LOW
Hemoglobin: 10.9 — ABNORMAL LOW
MCHC: 33.6
MCHC: 33.7
MCHC: 34.3
MCV: 90.2
MCV: 90.6
MCV: 91.5
Platelets: 172
Platelets: 211
Platelets: 218
RBC: 3.38 — ABNORMAL LOW
RBC: 3.39 — ABNORMAL LOW
RBC: 3.94 — ABNORMAL LOW
RDW: 13.5
RDW: 13.6
RDW: 13.6
RDW: 14.3
WBC: 10.8 — ABNORMAL HIGH
WBC: 11.4 — ABNORMAL HIGH
WBC: 5.4

## 2011-04-17 LAB — CULTURE, BLOOD (ROUTINE X 2)
Culture: NO GROWTH
Report Status: 1122009

## 2011-04-17 LAB — CARBOXYHEMOGLOBIN
Carboxyhemoglobin: 1.4
Carboxyhemoglobin: 1.5
Methemoglobin: 0.6
Methemoglobin: 0.9
O2 Saturation: 77.1
O2 Saturation: 80.8
Total hemoglobin: 11 — ABNORMAL LOW
Total hemoglobin: 11.1 — ABNORMAL LOW
Total oxygen content: 11.4 — ABNORMAL LOW
Total oxygen content: 11.6 — ABNORMAL LOW
Total oxygen content: 12.7 — ABNORMAL LOW
Total oxygen content: 12.8 — ABNORMAL LOW

## 2011-04-17 LAB — BLOOD GAS, ARTERIAL
Acid-base deficit: 5.9 — ABNORMAL HIGH
Acid-base deficit: 9.7 — ABNORMAL HIGH
Bicarbonate: 15.5 — ABNORMAL LOW
Bicarbonate: 18.8 — ABNORMAL LOW
Bicarbonate: 18.8 — ABNORMAL LOW
FIO2: 0.35
FIO2: 0.35
O2 Saturation: 98.9
Patient temperature: 37
TCO2: 14.5
TCO2: 16.1
pCO2 arterial: 24.8 — ABNORMAL LOW
pCO2 arterial: 32.9 — ABNORMAL LOW
pCO2 arterial: 35.8
pH, Arterial: 7.294 — ABNORMAL LOW
pO2, Arterial: 139 — ABNORMAL HIGH
pO2, Arterial: 160 — ABNORMAL HIGH
pO2, Arterial: 78.8 — ABNORMAL LOW

## 2011-04-17 LAB — PROTIME-INR
INR: 1.4
Prothrombin Time: 17.8 — ABNORMAL HIGH

## 2011-04-17 LAB — EXPECTORATED SPUTUM ASSESSMENT W GRAM STAIN, RFLX TO RESP C

## 2011-04-17 LAB — BASIC METABOLIC PANEL
BUN: 10
BUN: 14
BUN: 20
BUN: 31 — ABNORMAL HIGH
BUN: 8
CO2: 18 — ABNORMAL LOW
CO2: 24
CO2: 24
Calcium: 7.6 — ABNORMAL LOW
Calcium: 7.9 — ABNORMAL LOW
Calcium: 8.1 — ABNORMAL LOW
Calcium: 9
Chloride: 102
Chloride: 105
Chloride: 105
Creatinine, Ser: 0.78
Creatinine, Ser: 1.28
Creatinine, Ser: 1.64 — ABNORMAL HIGH
Creatinine, Ser: 1.83 — ABNORMAL HIGH
GFR calc Af Amer: 51 — ABNORMAL LOW
GFR calc Af Amer: 58 — ABNORMAL LOW
GFR calc Af Amer: >60
GFR calc non Af Amer: 59 — ABNORMAL LOW
GFR calc non Af Amer: >60
GFR calc non Af Amer: >60
GFR calc non Af Amer: >60
Glucose, Bld: 197 — ABNORMAL HIGH
Glucose, Bld: 279 — ABNORMAL HIGH
Glucose, Bld: 316 — ABNORMAL HIGH
Potassium: 3.6
Potassium: 3.7
Potassium: 3.9
Potassium: 4.4
Potassium: 4.5
Potassium: 4.7
Potassium: 6.5
Sodium: 128 — ABNORMAL LOW
Sodium: 136
Sodium: 136

## 2011-04-17 LAB — RAPID URINE DRUG SCREEN, HOSP PERFORMED
Amphetamines: NOT DETECTED
Barbiturates: NOT DETECTED
Benzodiazepines: NOT DETECTED
Tetrahydrocannabinol: NOT DETECTED

## 2011-04-17 LAB — LACTIC ACID, PLASMA: Lactic Acid, Venous: 0.6

## 2011-04-17 LAB — CULTURE, RESPIRATORY W GRAM STAIN: Culture: NORMAL

## 2011-04-17 LAB — URINALYSIS, ROUTINE W REFLEX MICROSCOPIC
Bilirubin Urine: NEGATIVE
Glucose, UA: NEGATIVE
Hgb urine dipstick: NEGATIVE
Protein, ur: NEGATIVE
Specific Gravity, Urine: 1.01

## 2011-04-17 LAB — HEMOGLOBIN A1C
Hgb A1c MFr Bld: 8.3 — ABNORMAL HIGH
Mean Plasma Glucose: 218

## 2011-04-17 LAB — URINE CULTURE: Colony Count: NO GROWTH

## 2011-04-17 LAB — LIPASE, BLOOD: Lipase: 19

## 2011-04-17 LAB — MAGNESIUM
Magnesium: 1.2 — ABNORMAL LOW
Magnesium: 1.4 — ABNORMAL LOW
Magnesium: 1.7

## 2011-04-17 LAB — CARDIAC PANEL(CRET KIN+CKTOT+MB+TROPI)
CK, MB: 9.9 — ABNORMAL HIGH
Relative Index: 0.6
Troponin I: 0.03

## 2011-04-17 LAB — HEPATIC FUNCTION PANEL
Alkaline Phosphatase: 45
Indirect Bilirubin: 1 — ABNORMAL HIGH
Total Protein: 5.8 — ABNORMAL LOW

## 2011-04-17 LAB — B-NATRIURETIC PEPTIDE (CONVERTED LAB): Pro B Natriuretic peptide (BNP): 63.5

## 2011-04-17 LAB — TYPE AND SCREEN: Antibody Screen: NEGATIVE

## 2011-04-17 LAB — GLUCOSE, RANDOM: Glucose, Bld: 143 — ABNORMAL HIGH

## 2011-04-17 LAB — AMYLASE: Amylase: 49

## 2011-04-17 LAB — PHOSPHORUS: Phosphorus: 1.6 — ABNORMAL LOW

## 2011-04-17 LAB — LEGIONELLA ANTIGEN, URINE: Legionella Antigen, Urine: NEGATIVE

## 2011-04-24 LAB — DIFFERENTIAL
Basophils Relative: 0
Lymphs Abs: 2.4
Monocytes Absolute: 0.7
Monocytes Relative: 7
Neutro Abs: 6.6

## 2011-04-24 LAB — BASIC METABOLIC PANEL
BUN: 21
CO2: 28
GFR calc non Af Amer: >60
Glucose, Bld: 113 — ABNORMAL HIGH
Potassium: 4.3

## 2011-04-24 LAB — CBC
Hemoglobin: 13.3
MCHC: 34.9
MCV: 90.9
RBC: 4.18 — ABNORMAL LOW
WBC: 9.9

## 2014-04-07 ENCOUNTER — Encounter: Payer: Self-pay | Admitting: Family Medicine

## 2014-04-07 ENCOUNTER — Telehealth: Payer: Self-pay | Admitting: Family Medicine

## 2014-04-07 ENCOUNTER — Ambulatory Visit (INDEPENDENT_AMBULATORY_CARE_PROVIDER_SITE_OTHER): Payer: 59 | Admitting: Family Medicine

## 2014-04-07 ENCOUNTER — Encounter (INDEPENDENT_AMBULATORY_CARE_PROVIDER_SITE_OTHER): Payer: Self-pay

## 2014-04-07 ENCOUNTER — Encounter: Payer: Self-pay | Admitting: *Deleted

## 2014-04-07 VITALS — BP 94/59 | HR 86 | Temp 98.5°F | Ht 72.0 in | Wt 167.0 lb

## 2014-04-07 DIAGNOSIS — B349 Viral infection, unspecified: Secondary | ICD-10-CM

## 2014-04-07 DIAGNOSIS — R05 Cough: Secondary | ICD-10-CM

## 2014-04-07 DIAGNOSIS — R059 Cough, unspecified: Secondary | ICD-10-CM

## 2014-04-07 DIAGNOSIS — J029 Acute pharyngitis, unspecified: Secondary | ICD-10-CM

## 2014-04-07 LAB — POCT RAPID STREP A (OFFICE): RAPID STREP A SCREEN: NEGATIVE

## 2014-04-07 NOTE — Telephone Encounter (Signed)
Appt given for today 

## 2014-04-07 NOTE — Patient Instructions (Signed)
Rest and fluids for the next 24-36 hours--- avoid milk cheese ice cream and dairy, continue with a bland diet and fluids  Take Tylenol or ibuprofen as needed for aches pains and fever Remain out of work for the weekend We will call you with the results of the strep culture once those results are available

## 2014-04-07 NOTE — Progress Notes (Signed)
   Subjective:    Patient ID: [name], male    DOB: 04-13-1971, 43 y.o.   MRN: [id number]  HPI Patient here today for sore throat, cough, and chills that started yesterday.       There are no active problems to display for this patient.  Outpatient Encounter Prescriptions as of 04/07/2014  Medication Sig  . insulin NPH-regular Human (NOVOLIN 70/30) (70-30) 100 UNIT/ML injection Inject 15 Units into the skin 2 (two) times daily with a meal.    Review of Systems  Constitutional: Positive for chills. Negative for fever.  HENT: Positive for sore throat. Negative for congestion.   Eyes: Negative.   Respiratory: Positive for cough.   Cardiovascular: Negative.   Gastrointestinal: Negative.   Endocrine: Negative.   Genitourinary: Negative.   Musculoskeletal: Negative.   Skin: Negative.   Allergic/Immunologic: Negative.   Neurological: Positive for headaches.  Hematological: Negative.   Psychiatric/Behavioral: Negative.        Objective:   Physical Exam  Nursing note and vitals reviewed. Constitutional: He is oriented to person, place, and time. He appears well-developed and well-nourished. No distress.  HENT:  Head: Normocephalic and atraumatic.  Right Ear: External ear normal.  Left Ear: External ear normal.  Nose: Nose normal.  Mouth/Throat: No oropharyngeal exudate.  Minimal redness posterior throat  Eyes: Conjunctivae and EOM are normal. Pupils are equal, round, and reactive to light. Right eye exhibits no discharge. Left eye exhibits no discharge. No scleral icterus.  Neck: Normal range of motion. Neck supple. No thyromegaly present.  Cardiovascular: Normal rate, regular rhythm, normal heart sounds and intact distal pulses.  Exam reveals no gallop and no friction rub.   No murmur heard. Pulmonary/Chest: Effort normal and breath sounds normal. No respiratory distress. He has no wheezes. He has no rales. He exhibits no tenderness.  Dry cough  Abdominal: Soft.  Bowel sounds are normal. He exhibits no mass. There is no tenderness. There is no rebound and no guarding.  Minimal gaseous distention  Musculoskeletal: Normal range of motion. He exhibits no edema.  Lymphadenopathy:    He has no cervical adenopathy.  Neurological: He is alert and oriented to person, place, and time. No cranial nerve deficit.  Skin: Skin is warm and dry. No rash noted.  Psychiatric: He has a normal mood and affect. His behavior is normal. Judgment and thought content normal.    BP 94/59  Pulse 86  Temp(Src) 98.5 F (36.9 C) (Oral)  Ht 6' (1.829 m)  Wt 167 lb (75.751 kg)  BMI 22.64 kg/m2 Results for orders placed in visit on 04/07/14  POCT RAPID STREP A (OFFICE)      Result Value Ref Range   Rapid Strep A Screen Negative  Negative         Assessment & Plan:  1. Sore throat - POCT rapid strep A - Strep A culture, throat  2. Viral syndrome   Patient Instructions  Rest and fluids for the next 24-36 hours--- avoid milk cheese ice cream and dairy, continue with a bland diet and fluids  Take Tylenol or ibuprofen as needed for aches pains and fever Remain out of work for the weekend We will call you with the results of the strep culture once those results are available    Nyra Capes MD

## 2014-04-10 LAB — STREP A CULTURE, THROAT: STREP A CULTURE: NEGATIVE

## 2014-12-18 ENCOUNTER — Encounter: Payer: Self-pay | Admitting: Physician Assistant

## 2014-12-18 ENCOUNTER — Telehealth: Payer: Self-pay | Admitting: Physician Assistant

## 2014-12-18 ENCOUNTER — Ambulatory Visit (INDEPENDENT_AMBULATORY_CARE_PROVIDER_SITE_OTHER): Payer: 59 | Admitting: Physician Assistant

## 2014-12-18 ENCOUNTER — Encounter (INDEPENDENT_AMBULATORY_CARE_PROVIDER_SITE_OTHER): Payer: Self-pay

## 2014-12-18 VITALS — BP 101/62 | HR 94 | Temp 97.2°F | Ht 72.0 in | Wt 166.0 lb

## 2014-12-18 DIAGNOSIS — E10649 Type 1 diabetes mellitus with hypoglycemia without coma: Secondary | ICD-10-CM

## 2014-12-18 DIAGNOSIS — Z0000 Encounter for general adult medical examination without abnormal findings: Secondary | ICD-10-CM

## 2014-12-18 LAB — POCT CBC
GRANULOCYTE PERCENT: 47.9 % (ref 37–80)
HEMATOCRIT: 41.4 % — AB (ref 43.5–53.7)
HEMOGLOBIN: 13.2 g/dL — AB (ref 14.1–18.1)
LYMPH, POC: 2.9 (ref 0.6–3.4)
MCH, POC: 29 pg (ref 27–31.2)
MCHC: 31.9 g/dL (ref 31.8–35.4)
MCV: 90.9 fL (ref 80–97)
MPV: 7.6 fL (ref 0–99.8)
PLATELET COUNT, POC: 246 10*3/uL (ref 142–424)
POC Granulocyte: 3.1 (ref 2–6.9)
POC LYMPH %: 45.4 % (ref 10–50)
RBC: 4.56 M/uL — AB (ref 4.69–6.13)
RDW, POC: 14.4 %
WBC: 6.4 10*3/uL (ref 4.6–10.2)

## 2014-12-18 LAB — POCT GLYCOSYLATED HEMOGLOBIN (HGB A1C): Hemoglobin A1C: 6.8

## 2014-12-18 MED ORDER — GLUCOSE BLOOD VI STRP: ORAL_STRIP | Status: DC

## 2014-12-18 NOTE — Progress Notes (Signed)
   Subjective:    Patient ID: [name], male    DOB: Jan 22, 1971, 44 y.o.   MRN: [id number]  HPI 44 y/o male presents for baseline labs. He is a Type I diabetic and manages his blood sugar with otc Novolin 70/30. He has been using this for 5 years. States that his fbs range from 30-250. His PCP in the past was Dr. Wende Neighbors but he has not seen him in 5 years.     Review of Systems  Constitutional: Positive for chills and diaphoresis (when BS gets low, mainly at night ).  HENT: Negative.   Eyes: Negative.   Respiratory: Negative.   Cardiovascular: Negative.   Gastrointestinal: Negative.   Endocrine: Negative.   Genitourinary: Negative.   Musculoskeletal: Negative.   All other systems reviewed and are negative.      Objective:   Physical Exam  Constitutional: He is oriented to person, place, and time. He appears well-developed and well-nourished. No distress.  HENT:  Head: Normocephalic.  Cardiovascular: Normal rate, regular rhythm and normal heart sounds.  Exam reveals no gallop and no friction rub.   No murmur heard. Pulmonary/Chest: Effort normal and breath sounds normal. No respiratory distress. He has no wheezes. He has no rales. He exhibits no tenderness.  Musculoskeletal: Normal range of motion. He exhibits no edema or tenderness.  Neurological: He is alert and oriented to person, place, and time. No cranial nerve deficit. Coordination normal.  5 point discrimination WNL on bilateral feet   Skin: He is not diaphoretic.  Psychiatric: He has a normal mood and affect. His behavior is normal. Judgment and thought content normal.  Nursing note and vitals reviewed.         Assessment & Plan:  1. Type 1 diabetes mellitus with hypoglycemia and without coma - Discussed consultaiton with clinical pharmacist for better management of blood sugars to prevent excessive hyper and hypoglycemic episodes.  - POCT CBC - HgB A1c  2. Preventative health care  - Lipid panel -  PSA, total and free - CMP14+EGFR - Vit D  25 hydroxy (rtn osteoporosis monitoring)   Continue all meds Labs pending Health Maintenance reviewed Diet and exercise encouraged RTO 3 months depending on lab results   Lipa Knauff A. Benjamin Stain PA-C

## 2014-12-18 NOTE — Addendum Note (Signed)
Addended by: HANDY, Ayssa Bentivegna N on: 12/18/2014 08:47 AM   Modules accepted: Orders  

## 2014-12-18 NOTE — Telephone Encounter (Signed)
RX for test strips sent into pharmacy

## 2014-12-18 NOTE — Addendum Note (Signed)
Addended by: Tommas OlpHANDY, Quincie Haroon N on: 12/18/2014 08:47 AM   Modules accepted: Orders

## 2014-12-19 ENCOUNTER — Other Ambulatory Visit: Payer: Self-pay | Admitting: *Deleted

## 2014-12-19 LAB — CMP14+EGFR
A/G RATIO: 1.7 (ref 1.1–2.5)
ALT: 17 IU/L (ref 0–44)
AST: 30 IU/L (ref 0–40)
Albumin: 4.3 g/dL (ref 3.5–5.5)
Alkaline Phosphatase: 68 IU/L (ref 39–117)
BILIRUBIN TOTAL: 0.8 mg/dL (ref 0.0–1.2)
BUN/Creatinine Ratio: 17 (ref 9–20)
BUN: 18 mg/dL (ref 6–24)
CALCIUM: 9.3 mg/dL (ref 8.7–10.2)
CHLORIDE: 98 mmol/L (ref 97–108)
CO2: 24 mmol/L (ref 18–29)
Creatinine, Ser: 1.07 mg/dL (ref 0.76–1.27)
GFR calc Af Amer: 98 mL/min/{1.73_m2} (ref >59)
GFR calc non Af Amer: 85 mL/min/{1.73_m2} (ref >59)
GLOBULIN, TOTAL: 2.6 g/dL (ref 1.5–4.5)
Glucose: 88 mg/dL (ref 65–99)
Potassium: 4.2 mmol/L (ref 3.5–5.2)
Sodium: 137 mmol/L (ref 134–144)
Total Protein: 6.9 g/dL (ref 6.0–8.5)

## 2014-12-19 LAB — LIPID PANEL
Chol/HDL Ratio: 4.8 ratio units (ref 0.0–5.0)
Cholesterol, Total: 176 mg/dL (ref 100–199)
HDL: 37 mg/dL — AB (ref >39)
LDL Calculated: 106 mg/dL — ABNORMAL HIGH (ref 0–99)
Triglycerides: 164 mg/dL — ABNORMAL HIGH (ref 0–149)
VLDL Cholesterol Cal: 33 mg/dL (ref 5–40)

## 2014-12-19 LAB — PSA, TOTAL AND FREE
PSA FREE PCT: 50 %
PSA, Free: 0.25 ng/mL
Prostate Specific Ag, Serum: 0.5 ng/mL (ref 0.0–4.0)

## 2014-12-19 LAB — VITAMIN D 25 HYDROXY (VIT D DEFICIENCY, FRACTURES): Vit D, 25-Hydroxy: 22.4 ng/mL — ABNORMAL LOW (ref 30.0–100.0)

## 2014-12-19 MED ORDER — GLUCOSE BLOOD VI STRP: ORAL_STRIP | Status: DC

## 2014-12-20 ENCOUNTER — Encounter: Payer: Self-pay | Admitting: Physician Assistant

## 2014-12-20 DIAGNOSIS — E782 Mixed hyperlipidemia: Secondary | ICD-10-CM

## 2014-12-20 DIAGNOSIS — E1069 Type 1 diabetes mellitus with other specified complication: Secondary | ICD-10-CM | POA: Insufficient documentation

## 2014-12-20 DIAGNOSIS — E559 Vitamin D deficiency, unspecified: Secondary | ICD-10-CM | POA: Insufficient documentation

## 2014-12-20 DIAGNOSIS — E109 Type 1 diabetes mellitus without complications: Secondary | ICD-10-CM | POA: Insufficient documentation

## 2015-01-22 ENCOUNTER — Other Ambulatory Visit: Payer: Self-pay

## 2015-01-25 ENCOUNTER — Ambulatory Visit: Payer: Self-pay

## 2015-02-12 ENCOUNTER — Encounter: Payer: Self-pay | Admitting: *Deleted

## 2015-03-22 ENCOUNTER — Ambulatory Visit: Payer: 59 | Admitting: Physician Assistant

## 2015-03-26 ENCOUNTER — Encounter: Payer: Self-pay | Admitting: Physician Assistant

## 2015-03-26 ENCOUNTER — Ambulatory Visit (INDEPENDENT_AMBULATORY_CARE_PROVIDER_SITE_OTHER): Payer: 59 | Admitting: Physician Assistant

## 2015-03-26 VITALS — BP 86/52 | HR 81 | Temp 97.6°F | Ht 72.0 in | Wt 162.0 lb

## 2015-03-26 DIAGNOSIS — E10641 Type 1 diabetes mellitus with hypoglycemia with coma: Secondary | ICD-10-CM

## 2015-03-26 DIAGNOSIS — E10649 Type 1 diabetes mellitus with hypoglycemia without coma: Secondary | ICD-10-CM

## 2015-03-26 DIAGNOSIS — E1065 Type 1 diabetes mellitus with hyperglycemia: Secondary | ICD-10-CM

## 2015-03-26 LAB — POCT GLYCOSYLATED HEMOGLOBIN (HGB A1C): Hemoglobin A1C: 7.4

## 2015-03-26 MED ORDER — GLUCOSE BLOOD VI STRP: ORAL_STRIP | Status: DC

## 2015-03-26 NOTE — Progress Notes (Signed)
   Subjective:    Patient ID: [name], male    DOB: 06/10/1971, 44 y.o.   MRN: [id number]  HPI 44 y/o male presents for f/u of DM type 1. He states that his bs were high last week ranging from 190-310 while he was on vacation.   He did not follow up with Clinical Pharmacist as suggested at his last appointment. He uses OTC insulin. His fbs have ranged from 65-175.     Review of Systems  Constitutional: Negative.   HENT: Negative.   Eyes: Negative.   Respiratory: Negative.   Cardiovascular: Negative.   Gastrointestinal: Negative.   Endocrine: Negative.   Genitourinary: Negative.   Musculoskeletal: Negative.   Skin: Negative.   Neurological: Negative.   Hematological: Negative.   Psychiatric/Behavioral: Negative.        Objective:   Physical Exam  Constitutional: He is oriented to person, place, and time. He appears well-developed and well-nourished. No distress.  HENT:  Head: Normocephalic and atraumatic.  Cardiovascular: Normal rate, regular rhythm, normal heart sounds and intact distal pulses.  Exam reveals no gallop and no friction rub.   No murmur heard. Pulmonary/Chest: Effort normal and breath sounds normal. No respiratory distress. He has no wheezes. He has no rales. He exhibits no tenderness.  Neurological: He is alert and oriented to person, place, and time.  Skin: He is not diaphoretic.  Psychiatric: He has a normal mood and affect. His behavior is normal. Judgment and thought content normal.  Nursing note and vitals reviewed.  Results for orders placed or performed in visit on 03/26/15  POCT glycosylated hemoglobin (Hb A1C)  Result Value Ref Range   Hemoglobin A1C 7.4           Assessment & Plan:  1. Type 1 diabetes mellitus with hypoglycemic coma  - glucose blood test strip; Contour test strips, test 6 times qd. E10.649  Dispense: 100 each; Refill: 3  2. Type 1 diabetes mellitus with hypoglycemia and without coma  - POCT glycosylated  hemoglobin (Hb A1C) - Lipid panel - CMP14+EGFR - Hepatic function panel - POCT UA - Microalbumin - glucose blood test strip; Contour test strips, test 6 times qd. E10.649  Dispense: 100 each; Refill: 3    [name] is using otc insulin at this time for DM mgmt. I have suggested he follow up with [name] for better control of his blood sugars. He agrees to do so.     Continue all meds Labs pending Health Maintenance reviewed Diet and exercise encouraged RTO 3 months + appt with [name] for DM type 1 mgmt   Robert Ceci A. Benjamin Stain PA-C

## 2015-03-26 NOTE — Patient Instructions (Signed)
Vitamin D Deficiency Vitamin D is an important vitamin that your body needs. Having too little of it in your body is called a deficiency. A very bad deficiency can make your bones soft and can cause a condition called rickets.  Vitamin D is important to your body for different reasons, such as:   It helps your body absorb 2 minerals called calcium and phosphorus.  It helps make your bones healthy.  It may prevent some diseases, such as diabetes and multiple sclerosis.  It helps your muscles and heart. You can get vitamin D in several ways. It is a natural part of some foods. The vitamin is also added to some dairy products and cereals. Some people take vitamin D supplements. Also, your body makes vitamin D when you are in the sun. It changes the sun's rays into a form of the vitamin that your body can use. CAUSES   Not eating enough foods that contain vitamin D.  Not getting enough sunlight.  Having certain digestive system diseases that make it hard to absorb vitamin D. These diseases include Crohn's disease, chronic pancreatitis, and cystic fibrosis.  Having a surgery in which part of the stomach or small intestine is removed.  Being obese. Fat cells pull vitamin D out of your blood. That means that obese people may not have enough vitamin D left in their blood and in other body tissues.  Having chronic kidney or liver disease. RISK FACTORS Risk factors are things that make you more likely to develop a vitamin D deficiency. They include:  Being older.  Not being able to get outside very much.  Living in a nursing home.  Having had broken bones.  Having weak or thin bones (osteoporosis).  Having a disease or condition that changes how your body absorbs vitamin D.  Having dark skin.  Some medicines such as seizure medicines or steroids.  Being overweight or obese. SYMPTOMS Mild cases of vitamin D deficiency may not have any symptoms. If you have a very bad case, symptoms  may include:  Bone pain.  Muscle pain.  Falling often.  Broken bones caused by a minor injury, due to osteoporosis. DIAGNOSIS A blood test is the best way to tell if you have a vitamin D deficiency. TREATMENT Vitamin D deficiency can be treated in different ways. Treatment for vitamin D deficiency depends on what is causing it. Options include:  Taking vitamin D supplements.  Taking a calcium supplement. Your caregiver will suggest what dose is best for you. HOME CARE INSTRUCTIONS  Take any supplements that your caregiver prescribes. Follow the directions carefully. Take only the suggested amount.  Have your blood tested 2 months after you start taking supplements.  Eat foods that contain vitamin D. Healthy choices include:  Fortified dairy products, cereals, or juices. Fortified means vitamin D has been added to the food. Check the label on the package to be sure.  Fatty fish like salmon or trout.  Eggs.  Oysters.  Do not use a tanning bed.  Keep your weight at a healthy level. Lose weight if you need to.  Keep all follow-up appointments. Your caregiver will need to perform blood tests to make sure your vitamin D deficiency is going away. SEEK MEDICAL CARE IF:  You have any questions about your treatment.  You continue to have symptoms of vitamin D deficiency.  You have nausea or vomiting.  You are constipated.  You feel confused.  You have severe abdominal or back pain. MAKE   SURE YOU:  Understand these instructions.  Will watch your condition.  Will get help right away if you are not doing well or get worse. Document Released: 10/06/2011 Document Revised: 11/08/2012 Document Reviewed: 10/06/2011 Exit[employer] Patient Information 2015 [employer]. This information is not intended to replace advice given to you by your health care provider. Make sure you discuss any questions you have with your health care provider.  

## 2015-03-27 LAB — CMP14+EGFR
A/G RATIO: 1.5 (ref 1.1–2.5)
ALBUMIN: 4.2 g/dL (ref 3.5–5.5)
ALT: 12 IU/L (ref 0–44)
AST: 19 IU/L (ref 0–40)
Alkaline Phosphatase: 68 IU/L (ref 39–117)
BILIRUBIN TOTAL: 0.6 mg/dL (ref 0.0–1.2)
BUN / CREAT RATIO: 14 (ref 9–20)
BUN: 18 mg/dL (ref 6–24)
CHLORIDE: 97 mmol/L (ref 97–108)
CO2: 24 mmol/L (ref 18–29)
Calcium: 9 mg/dL (ref 8.7–10.2)
Creatinine, Ser: 1.28 mg/dL — ABNORMAL HIGH (ref 0.76–1.27)
GFR calc Af Amer: 79 mL/min/{1.73_m2} (ref >59)
GFR calc non Af Amer: 68 mL/min/{1.73_m2} (ref >59)
Globulin, Total: 2.8 g/dL (ref 1.5–4.5)
Glucose: 179 mg/dL — ABNORMAL HIGH (ref 65–99)
POTASSIUM: 4.9 mmol/L (ref 3.5–5.2)
Sodium: 138 mmol/L (ref 134–144)
TOTAL PROTEIN: 7 g/dL (ref 6.0–8.5)

## 2015-03-27 LAB — LIPID PANEL
Chol/HDL Ratio: 5 ratio units (ref 0.0–5.0)
Cholesterol, Total: 175 mg/dL (ref 100–199)
HDL: 35 mg/dL — AB (ref >39)
LDL Calculated: 111 mg/dL — ABNORMAL HIGH (ref 0–99)
Triglycerides: 146 mg/dL (ref 0–149)
VLDL CHOLESTEROL CAL: 29 mg/dL (ref 5–40)

## 2015-03-27 LAB — HEPATIC FUNCTION PANEL: Bilirubin, Direct: 0.15 mg/dL (ref 0.00–0.40)

## 2015-03-28 ENCOUNTER — Other Ambulatory Visit: Payer: Self-pay | Admitting: Physician Assistant

## 2015-03-28 DIAGNOSIS — E10641 Type 1 diabetes mellitus with hypoglycemia with coma: Secondary | ICD-10-CM

## 2015-03-28 DIAGNOSIS — E10649 Type 1 diabetes mellitus with hypoglycemia without coma: Secondary | ICD-10-CM

## 2015-03-28 MED ORDER — GLUCOSE BLOOD VI STRP: ORAL_STRIP | Status: DC

## 2015-04-05 ENCOUNTER — Telehealth: Payer: Self-pay | Admitting: Family Medicine

## 2015-04-05 NOTE — Telephone Encounter (Signed)
Patient advised to check with pharmacy that Tiffany had sent in for 500.

## 2015-04-13 ENCOUNTER — Encounter: Payer: Self-pay | Admitting: *Deleted

## 2015-04-18 ENCOUNTER — Ambulatory Visit (INDEPENDENT_AMBULATORY_CARE_PROVIDER_SITE_OTHER): Payer: 59 | Admitting: Pharmacist

## 2015-04-18 ENCOUNTER — Encounter: Payer: Self-pay | Admitting: Pharmacist

## 2015-04-18 VITALS — BP 90/56 | HR 80 | Ht 72.0 in | Wt 164.0 lb

## 2015-04-18 DIAGNOSIS — E109 Type 1 diabetes mellitus without complications: Secondary | ICD-10-CM | POA: Diagnosis not present

## 2015-04-18 MED ORDER — INSULIN LISPRO 100 UNIT/ML (KWIKPEN): PEN_INJECTOR | SUBCUTANEOUS | Status: DC

## 2015-04-18 NOTE — Progress Notes (Signed)
Subjective:      [name] is a 44 y.o. male who presents for an initial visit for evaluation of Type 1 diabetes mellitus.  The initial diagnosis of diabetes was made when patient was in his 20's (11/1999)   His clinical course has fluctuated. Insulin dosage review with K[place] suggested compliance most of the time. Associated symptoms of hyperglycemia have been none.  Associated symptoms of hypoglycemia have been dizziness, headache and sweating.   He is currently taking Novolin 70/30 mix - he is taking 15 units two to three times a day - usually around 9 or 10 am, 2pm and at bedtime if needed.  Compliance with blood glucose monitoring: good.  The patient does perform independently. Rotation of sites for injection: abdominal wall and thigh(s) Exercise: rarely  Meal panning: He is using no plan. Blood glucose times and ranges:            Breakfast 200 +/- 100 mg/dl           Lunch     82 +/- 10 mg/dl           Dinner    161 +/- 30 mg/dl           HS        096 +/- 25 mg/dl           Overall   045        MedicAlert Identification Noted? no   The following portions of the patient's history were reviewed and updated as appropriate: allergies, current medications, past family history, past medical history, past social history, past surgical history and problem list.   Objective:    BP 90/56 mmHg  Pulse 80  Ht 6' (1.829 m)  Wt 164 lb (74.39 kg)  BMI 22.24 kg/m2  Lab Review      hemoglobin A1C - 7.4% (03/26/2015)     Assessment:    Diabetes Mellitus type I, under inadequate control.    Plan:    1.  RX changes: change Novolin 70/30 insulin to 15 units with breakfast and 15 units with evening meal.  Patient is given Rx for Humalog Insulin with coupon to get 1 box free - directions are to use at lunch as needed for BG over 200 to 250 - give 2 units; 251 to 300 - 3 units; 301 or above 4 units. 2.  Education:  interpretation of lab results, blood sugar goals, hypoglycemia  prevention and treatment, exercise, nutrition, carbohydrate counting, insulin adjustments and use of sliding scale/correction formula  3.  Discuss using different insulin in future if unable to get BG to goal with 70/30/ mix.   4. Follow up: I recommend diabetes care be 3 months.    Henrene Pastor, PharmD, CPP, CDE

## 2015-04-18 NOTE — Patient Instructions (Signed)
Diabetes and Standards of Medical Care   Diabetes is complicated. You may find that your diabetes team includes a dietitian, nurse, diabetes educator, eye doctor, and more. To help everyone know what is going on and to help you get the care you deserve, the following schedule of care was developed to help keep you on track. Below are the tests, exams, vaccines, medicines, education, and plans you will need.  Blood Glucose Goals Prior to meals = 80 - 130 Within 2 hours of the start of a meal = less than 180  HbA1c test (goal is less than 7.0% - your last value was 7.4%) This test shows how well you have controlled your glucose over the past 2 to 3 months. It is used to see if your diabetes management plan needs to be adjusted.   It is performed at least 2 times a year if you are meeting treatment goals.  It is performed 4 times a year if therapy has changed or if you are not meeting treatment goals.  Blood pressure test  This test is performed at every routine medical visit. The goal is less than 140/90 mmHg for most people, but 130/80 mmHg in some cases. Ask your health care provider about your goal.  Dental exam  Follow up with the dentist regularly.  Eye exam  If you are diagnosed with type 1 diabetes as a child, get an exam upon reaching the age of 10 years or older and have had diabetes for 3 to 5 years. Yearly eye exams are recommended after that initial eye exam.  If you are diagnosed with type 1 diabetes as an adult, get an exam within 5 years of diagnosis and then yearly.  If you are diagnosed with type 2 diabetes, get an exam as soon as possible after the diagnosis and then yearly.  Foot care exam  Visual foot exams are performed at every routine medical visit. The exams check for cuts, injuries, or other problems with the feet.  A comprehensive foot exam should be done yearly. This includes visual inspection as well as assessing foot pulses and testing for loss of  sensation.  Check your feet nightly for cuts, injuries, or other problems with your feet. Tell your health care provider if anything is not healing.  Kidney function test (urine microalbumin)  This test is performed once a year.  Type 1 diabetes: The first test is performed 5 years after diagnosis.  Type 2 diabetes: The first test is performed at the time of diagnosis.  A serum creatinine and estimated glomerular filtration rate (eGFR) test is done once a year to assess the level of chronic kidney disease (CKD), if present.  Lipid profile (cholesterol, HDL, LDL, triglycerides)  Performed every 5 years for most people.  The goal for LDL is less than 100 mg/dL. If you are at high risk, the goal is less than 70 mg/dL.  The goal for HDL is 40 mg/dL to 50 mg/dL for men and 50 mg/dL to 60 mg/dL for women. An HDL cholesterol of 60 mg/dL or higher gives some protection against heart disease.  The goal for triglycerides is less than 150 mg/dL.  Influenza vaccine, pneumococcal vaccine, and hepatitis B vaccine  The influenza vaccine is recommended yearly.  The pneumococcal vaccine is generally given once in a lifetime. However, there are some instances when another vaccination is recommended. Check with your health care provider.  The hepatitis B vaccine is also recommended for adults with diabetes.    Diabetes self-management education  Education is recommended at diagnosis and ongoing as needed.  Treatment plan  Your treatment plan is reviewed at every medical visit.  Document Released: 05/11/2009 Document Revised: 03/16/2013 Document Reviewed: 12/14/2012 Exit[employer] Patient Information 2014 [employer].   

## 2015-06-28 ENCOUNTER — Ambulatory Visit: Payer: 59 | Admitting: Physician Assistant

## 2015-06-28 ENCOUNTER — Encounter: Payer: Self-pay | Admitting: Family

## 2015-06-28 ENCOUNTER — Ambulatory Visit (INDEPENDENT_AMBULATORY_CARE_PROVIDER_SITE_OTHER): Payer: 59 | Admitting: Family

## 2015-06-28 VITALS — BP 90/63 | HR 86 | Temp 97.4°F | Ht 72.0 in | Wt 165.4 lb

## 2015-06-28 DIAGNOSIS — E559 Vitamin D deficiency, unspecified: Secondary | ICD-10-CM | POA: Diagnosis not present

## 2015-06-28 DIAGNOSIS — E782 Mixed hyperlipidemia: Secondary | ICD-10-CM

## 2015-06-28 DIAGNOSIS — E1065 Type 1 diabetes mellitus with hyperglycemia: Secondary | ICD-10-CM | POA: Diagnosis not present

## 2015-06-28 DIAGNOSIS — Z23 Encounter for immunization: Secondary | ICD-10-CM | POA: Diagnosis not present

## 2015-06-28 LAB — POCT GLYCOSYLATED HEMOGLOBIN (HGB A1C): HEMOGLOBIN A1C: 7.5

## 2015-06-28 NOTE — Progress Notes (Signed)
   Subjective:    Patient ID: [name], male    DOB: 1971/05/22, 44 y.o.   MRN: [id number]  Pt presents to the office today for chronic follow up.  Diabetes He has type 1 diabetes mellitus. His disease course has been fluctuating. Hypoglycemia symptoms include confusion and dizziness. Pertinent negatives for diabetes include no blurred vision, no foot paresthesias, no foot ulcerations and no visual change. There are no hypoglycemic complications. Symptoms are stable. Pertinent negatives for diabetic complications include no CVA, heart disease, nephropathy or peripheral neuropathy. Risk factors for coronary artery disease include dyslipidemia, diabetes mellitus and male sex. Current diabetic treatment includes insulin injections. He is compliant with treatment all of the time. He is following a generally healthy diet. His breakfast blood glucose range is generally 140-180 mg/dl. An ACE inhibitor/angiotensin II receptor blocker is not being taken. Eye exam is current (Pt has appt on 07/03/15).      Review of Systems  Constitutional: Negative.   HENT: Negative.   Eyes: Negative for blurred vision.  Respiratory: Negative.   Cardiovascular: Negative.   Gastrointestinal: Negative.   Endocrine: Negative.   Genitourinary: Negative.   Musculoskeletal: Negative.   Neurological: Positive for dizziness.  Hematological: Negative.   Psychiatric/Behavioral: Positive for confusion.  All other systems reviewed and are negative.      Objective:   Physical Exam  Constitutional: He is oriented to person, place, and time. He appears well-developed and well-nourished. No distress.  HENT:  Head: Normocephalic.  Right Ear: External ear normal.  Left Ear: External ear normal.  Nose: Nose normal.  Mouth/Throat: Oropharynx is clear and moist.  Eyes: Pupils are equal, round, and reactive to light. Right eye exhibits no discharge. Left eye exhibits no discharge.  Neck: Normal range of motion. Neck  supple. No thyromegaly present.  Cardiovascular: Normal rate, regular rhythm, normal heart sounds and intact distal pulses.   No murmur heard. Pulmonary/Chest: Effort normal and breath sounds normal. No respiratory distress. He has no wheezes.  Abdominal: Soft. Bowel sounds are normal. He exhibits no distension. There is no tenderness.  Musculoskeletal: Normal range of motion. He exhibits no edema or tenderness.  Neurological: He is alert and oriented to person, place, and time. He has normal reflexes. No cranial nerve deficit.  Skin: Skin is warm and dry. No rash noted. No erythema.  Psychiatric: He has a normal mood and affect. His behavior is normal. Judgment and thought content normal.  Vitals reviewed.   BP 90/63 mmHg  Pulse 86  Temp(Src) 97.4 F (36.3 C) (Oral)  Ht 6' (1.829 m)  Wt 165 lb 6.4 oz (75.025 kg)  BMI 22.43 kg/m2       Assessment & Plan:  1. Type 1 diabetes mellitus with hyperglycemia (HCC) - POCT glycosylated hemoglobin (Hb A1C) - CMP14+EGFR - Microalbumin / creatinine urine ratio  2. Vitamin D deficiency - CMP14+EGFR  3. Elevated cholesterol with elevated triglycerides -Pt does not want to be on medication at this time- Will try diet and exercise - CMP14+EGFR - Lipid panel   Continue all meds Labs pending Health Maintenance reviewed Diet and exercise encouraged RTO 4 months  Evelina Dun, FNP

## 2015-06-28 NOTE — Addendum Note (Signed)
Addended by: Prescott GumLAND, Mileigh Tilley M on: 06/28/2015 09:29 AM   Modules accepted: Orders

## 2015-06-28 NOTE — Patient Instructions (Signed)
Fat and Cholesterol Restricted Diet  High levels of fat and cholesterol in your blood may lead to various health problems, such as diseases of the heart, blood vessels, gallbladder, liver, and pancreas. Fats are concentrated sources of energy that come in various forms. Certain types of fat, including saturated fat, may be harmful in excess. Cholesterol is a substance needed by your body in small amounts. Your body makes all the cholesterol it needs. Excess cholesterol comes from the food you eat.  When you have high levels of cholesterol and saturated fat in your blood, health problems can develop because the excess fat and cholesterol will gather along the walls of your blood vessels, causing them to narrow. Choosing the right foods will help you control your intake of fat and cholesterol. This will help keep the levels of these substances in your blood within normal limits and reduce your risk of disease.  WHAT IS MY PLAN?  Your health care provider recommends that you:  · Get no more than __________ % of the total calories in your daily diet from fat.  · Limit your intake of saturated fat to less than ______% of your total calories each day.  · Limit the amount of cholesterol in your diet to less than _________mg per day.  WHAT TYPES OF FAT SHOULD I CHOOSE?  · Choose healthy fats more often. Choose monounsaturated and polyunsaturated fats, such as olive and canola oil, flaxseeds, walnuts, almonds, and seeds.  · Eat more omega-3 fats. Good choices include salmon, mackerel, sardines, tuna, flaxseed oil, and ground flaxseeds. Aim to eat fish at least two times a week.  · Limit saturated fats. Saturated fats are primarily found in animal products, such as meats, butter, and cream. Plant sources of saturated fats include palm oil, palm kernel oil, and coconut oil.  · Avoid foods with partially hydrogenated oils in them. These contain trans fats. Examples of foods that contain trans fats are stick margarine, some tub  margarines, cookies, crackers, and other baked goods.  WHAT GENERAL GUIDELINES DO I NEED TO FOLLOW?  These guidelines for healthy eating will help you control your intake of fat and cholesterol:  · Check food labels carefully to identify foods with trans fats or high amounts of saturated fat.  · Fill one half of your plate with vegetables and green salads.  · Fill one fourth of your plate with whole grains. Look for the word "whole" as the first word in the ingredient list.  · Fill one fourth of your plate with lean protein foods.  · Limit fruit to two servings a day. Choose fruit instead of juice.  · Eat more foods that contain soluble fiber. Examples of foods that contain this type of fiber are apples, broccoli, carrots, beans, peas, and barley. Aim to get 20-30 g of fiber per day.  · Eat more home-cooked food and less restaurant, buffet, and fast food.  · Limit or avoid alcohol.  · Limit foods high in starch and sugar.  · Limit fried foods.  · Cook foods using methods other than frying. Baking, boiling, grilling, and broiling are all great options.  · Lose weight if you are overweight. Losing just 5-10% of your initial body weight can help your overall health and prevent diseases such as diabetes and heart disease.  WHAT FOODS CAN I EAT?  Grains  Whole grains, such as whole wheat or whole grain breads, crackers, cereals, and pasta. Unsweetened oatmeal, bulgur, barley, quinoa, or brown rice. Corn   or whole wheat flour tortillas.  Vegetables  Fresh or frozen vegetables (raw, steamed, roasted, or grilled). Green salads.  Fruits  All fresh, canned (in natural juice), or frozen fruits.  Meat and Other Protein Products  Ground beef (85% or leaner), grass-fed beef, or beef trimmed of fat. Skinless chicken or turkey. Ground chicken or turkey. Pork trimmed of fat. All fish and seafood. Eggs. Dried beans, peas, or lentils. Unsalted nuts or seeds. Unsalted canned or dry beans.  Dairy  Low-fat dairy products, such as skim or  1% milk, 2% or reduced-fat cheeses, low-fat ricotta or cottage cheese, or plain low-fat yogurt.  Fats and Oils  Tub margarines without trans fats. Light or reduced-fat mayonnaise and salad dressings. Avocado. Olive, canola, sesame, or safflower oils. Natural peanut or almond butter (choose ones without added sugar and oil).  The items listed above may not be a complete list of recommended foods or beverages. Contact your dietitian for more options.  WHAT FOODS ARE NOT RECOMMENDED?  Grains  White bread. White pasta. White rice. Cornbread. Bagels, pastries, and croissants. Crackers that contain trans fat.  Vegetables  White potatoes. Corn. Creamed or fried vegetables. Vegetables in a cheese sauce.  Fruits  Dried fruits. Canned fruit in light or heavy syrup. Fruit juice.  Meat and Other Protein Products  Fatty cuts of meat. Ribs, chicken wings, bacon, sausage, bologna, salami, chitterlings, fatback, hot dogs, bratwurst, and packaged luncheon meats. Liver and organ meats.  Dairy  Whole or 2% milk, cream, half-and-half, and cream cheese. Whole milk cheeses. Whole-fat or sweetened yogurt. Full-fat cheeses. Nondairy creamers and whipped toppings. Processed cheese, cheese spreads, or cheese curds.  Sweets and Desserts  Corn syrup, sugars, honey, and molasses. Candy. Jam and jelly. Syrup. Sweetened cereals. Cookies, pies, cakes, donuts, muffins, and ice cream.  Fats and Oils  Butter, stick margarine, lard, shortening, ghee, or bacon fat. Coconut, palm kernel, or palm oils.  Beverages  Alcohol. Sweetened drinks (such as sodas, lemonade, and fruit drinks or punches).  The items listed above may not be a complete list of foods and beverages to avoid. Contact your dietitian for more information.     This information is not intended to replace advice given to you by your health care provider. Make sure you discuss any questions you have with your health care provider.     Document Released: 07/14/2005 Document Revised: 08/04/2014  Document Reviewed: 10/12/2013  Elsevier Interactive Patient Education ©2016 [employer]  Health Maintenance, Male  A healthy lifestyle and preventative care can promote health and wellness.  · Maintain regular health, dental, and eye exams.  · Eat a healthy diet. Foods like vegetables, fruits, whole grains, low-fat dairy products, and lean protein foods contain the nutrients you need and are low in calories. Decrease your intake of foods high in solid fats, added sugars, and salt. Get information about a proper diet from your health care provider, if necessary.  · Regular physical exercise is one of the most important things you can do for your health. Most adults should get at least 150 minutes of moderate-intensity exercise (any activity that increases your heart rate and causes you to sweat) each week. In addition, most adults need muscle-strengthening exercises on 2 or more days a week.    · Maintain a healthy weight. The body mass index (BMI) is a screening tool to identify possible weight problems. It provides an estimate of body fat based on height and weight. Your health care provider can find your BMI   and can help you achieve or maintain a healthy weight. For males 20 years and older:    A BMI below 18.5 is considered underweight.    A BMI of 18.5 to 24.9 is normal.    A BMI of 25 to 29.9 is considered overweight.    A BMI of 30 and above is considered obese.  · Maintain normal blood lipids and cholesterol by exercising and minimizing your intake of saturated fat. Eat a balanced diet with plenty of fruits and vegetables. Blood tests for lipids and cholesterol should begin at age 20 and be repeated every 5 years. If your lipid or cholesterol levels are high, you are over age 50, or you are at high risk for heart disease, you may need your cholesterol levels checked more frequently. Ongoing high lipid and cholesterol levels should be treated with medicines if diet and exercise are not working.  · If you  smoke, find out from your health care provider how to quit. If you do not use tobacco, do not start.  · Lung cancer screening is recommended for adults aged 55-80 years who are at high risk for developing lung cancer because of a history of smoking. A yearly low-dose CT scan of the lungs is recommended for people who have at least a 30-pack-year history of smoking and are current smokers or have quit within the past 15 years. A pack year of smoking is smoking an average of 1 pack of cigarettes a day for 1 year (for example, a 30-pack-year history of smoking could mean smoking 1 pack a day for 30 years or 2 packs a day for 15 years). Yearly screening should continue until the smoker has stopped smoking for at least 15 years. Yearly screening should be stopped for people who develop a health problem that would prevent them from having lung cancer treatment.  · If you choose to drink alcohol, do not have more than 2 drinks per day. One drink is considered to be 12 oz (360 mL) of beer, 5 oz (150 mL) of wine, or 1.5 oz (45 mL) of liquor.  · Avoid the use of street drugs. Do not share needles with anyone. Ask for help if you need support or instructions about stopping the use of drugs.  · High blood pressure causes heart disease and increases the risk of stroke. High blood pressure is more likely to develop in:    People who have blood pressure in the end of the normal range (100-139/85-89 mm Hg).    People who are overweight or obese.    People who are African American.  · If you are 18-39 years of age, have your blood pressure checked every 3-5 years. If you are 40 years of age or older, have your blood pressure checked every year. You should have your blood pressure measured twice--once when you are at a hospital or clinic, and once when you are not at a hospital or clinic. Record the average of the two measurements. To check your blood pressure when you are not at a hospital or clinic, you can use:    An automated  blood pressure machine at a pharmacy.    A home blood pressure monitor.  · If you are 45-79 years old, ask your health care provider if you should take aspirin to prevent heart disease.  · Diabetes screening involves taking a blood sample to check your fasting blood sugar level. This should be done once every 3 years after age 45   if you are at a normal weight and without risk factors for diabetes. Testing should be considered at a younger age or be carried out more frequently if you are overweight and have at least 1 risk factor for diabetes.  · Colorectal cancer can be detected and often prevented. Most routine colorectal cancer screening begins at the age of 50 and continues through age 75. However, your health care provider may recommend screening at an earlier age if you have risk factors for colon cancer. On a yearly basis, your health care provider may provide home test kits to check for hidden blood in the stool. A small camera at the end of a tube may be used to directly examine the colon (sigmoidoscopy or colonoscopy) to detect the earliest forms of colorectal cancer. Talk to your health care provider about this at age 50 when routine screening begins. A direct exam of the colon should be repeated every 5-10 years through age 75, unless early forms of precancerous polyps or small growths are found.  · People who are at an increased risk for hepatitis B should be screened for this virus. You are considered at high risk for hepatitis B if:    You were born in a country where hepatitis B occurs often. Talk with your health care provider about which countries are considered high risk.    Your parents were born in a high-risk country and you have not received a shot to protect against hepatitis B (hepatitis B vaccine).    You have HIV or AIDS.    You use needles to inject street drugs.    You live with, or have sex with, someone who has hepatitis B.    You are a man who has sex with other men (MSM).    You get  hemodialysis treatment.    You take certain medicines for conditions like cancer, organ transplantation, and autoimmune conditions.  · Hepatitis C blood testing is recommended for all people born from 1945 through 1965 and any individual with known risk factors for hepatitis C.  · Healthy men should no longer receive prostate-specific antigen (PSA) blood tests as part of routine cancer screening. Talk to your health care provider about prostate cancer screening.  · Testicular cancer screening is not recommended for adolescents or adult males who have no symptoms. Screening includes self-exam, a health care provider exam, and other screening tests. Consult with your health care provider about any symptoms you have or any concerns you have about testicular cancer.  · Practice safe sex. Use condoms and avoid high-risk sexual practices to reduce the spread of sexually transmitted infections (STIs).  · You should be screened for STIs, including gonorrhea and chlamydia if:    You are sexually active and are younger than 24 years.    You are older than 24 years, and your health care provider tells you that you are at risk for this type of infection.    Your sexual activity has changed since you were last screened, and you are at an increased risk for chlamydia or gonorrhea. Ask your health care provider if you are at risk.  · If you are at risk of being infected with HIV, it is recommended that you take a prescription medicine daily to prevent HIV infection. This is called pre-exposure prophylaxis (PrEP). You are considered at risk if:    You are a man who has sex with other men (MSM).    You are a heterosexual man who is   sexually active with multiple partners.    You take drugs by injection.    You are sexually active with a partner who has HIV.    Talk with your health care provider about whether you are at high risk of being infected with HIV. If you choose to begin PrEP, you should first be tested for HIV. You should  then be tested every 3 months for as long as you are taking PrEP.  · Use sunscreen. Apply sunscreen liberally and repeatedly throughout the day. You should seek shade when your shadow is shorter than you. Protect yourself by wearing long sleeves, pants, a wide-brimmed hat, and sunglasses year round whenever you are outdoors.  · Tell your health care provider of new moles or changes in moles, especially if there is a change in shape or color. Also, tell your health care provider if a mole is larger than the size of a pencil eraser.  · A one-time screening for abdominal aortic aneurysm (AAA) and surgical repair of large AAAs by ultrasound is recommended for men aged 65-75 years who are current or former smokers.  · Stay current with your vaccines (immunizations).     This information is not intended to replace advice given to you by your health care provider. Make sure you discuss any questions you have with your health care provider.     Document Released: 01/10/2008 Document Revised: 08/04/2014 Document Reviewed: 12/09/2010  Elsevier Interactive Patient Education ©2016 [employer] Inc[employer]

## 2015-06-29 LAB — CMP14+EGFR
A/G RATIO: 1.7 (ref 1.1–2.5)
ALT: 15 IU/L (ref 0–44)
AST: 27 IU/L (ref 0–40)
Albumin: 4.5 g/dL (ref 3.5–5.5)
Alkaline Phosphatase: 67 IU/L (ref 39–117)
BUN/Creatinine Ratio: 16 (ref 9–20)
BUN: 19 mg/dL (ref 6–24)
Bilirubin Total: 0.9 mg/dL (ref 0.0–1.2)
CALCIUM: 9.2 mg/dL (ref 8.7–10.2)
CO2: 26 mmol/L (ref 18–29)
Chloride: 95 mmol/L — ABNORMAL LOW (ref 97–106)
Creatinine, Ser: 1.16 mg/dL (ref 0.76–1.27)
GFR, EST AFRICAN AMERICAN: 89 mL/min/{1.73_m2} (ref >59)
GFR, EST NON AFRICAN AMERICAN: 77 mL/min/{1.73_m2} (ref >59)
Globulin, Total: 2.7 g/dL (ref 1.5–4.5)
Glucose: 107 mg/dL — ABNORMAL HIGH (ref 65–99)
POTASSIUM: 4.3 mmol/L (ref 3.5–5.2)
Sodium: 135 mmol/L — ABNORMAL LOW (ref 136–144)
TOTAL PROTEIN: 7.2 g/dL (ref 6.0–8.5)

## 2015-06-29 LAB — LIPID PANEL
CHOL/HDL RATIO: 4.5 ratio (ref 0.0–5.0)
Cholesterol, Total: 186 mg/dL (ref 100–199)
HDL: 41 mg/dL (ref >39)
LDL Calculated: 123 mg/dL — ABNORMAL HIGH (ref 0–99)
Triglycerides: 111 mg/dL (ref 0–149)
VLDL CHOLESTEROL CAL: 22 mg/dL (ref 5–40)

## 2015-07-13 LAB — HM DIABETES EYE EXAM

## 2015-11-01 ENCOUNTER — Ambulatory Visit: Payer: 59 | Admitting: Family

## 2015-11-13 ENCOUNTER — Encounter: Payer: Self-pay | Admitting: Family

## 2015-11-13 ENCOUNTER — Ambulatory Visit (INDEPENDENT_AMBULATORY_CARE_PROVIDER_SITE_OTHER): Payer: 59 | Admitting: Family

## 2015-11-13 VITALS — BP 85/55 | HR 79 | Temp 97.0°F | Ht 72.0 in | Wt 164.8 lb

## 2015-11-13 DIAGNOSIS — E1065 Type 1 diabetes mellitus with hyperglycemia: Secondary | ICD-10-CM | POA: Diagnosis not present

## 2015-11-13 DIAGNOSIS — E782 Mixed hyperlipidemia: Secondary | ICD-10-CM

## 2015-11-13 DIAGNOSIS — E559 Vitamin D deficiency, unspecified: Secondary | ICD-10-CM | POA: Diagnosis not present

## 2015-11-13 LAB — BAYER DCA HB A1C WAIVED: HB A1C: 7.5 % — AB (ref <7.0)

## 2015-11-13 NOTE — Progress Notes (Signed)
   Subjective:    Patient ID: [name], male    DOB: [birth year], 45 y.o.   MRN: [id number]  Pt presents to the office today for chronic follow up.  Diabetes He has type 1 diabetes mellitus. His disease course has been fluctuating. Hypoglycemia symptoms include confusion and dizziness. Pertinent negatives for diabetes include no blurred vision, no foot paresthesias, no foot ulcerations and no visual change. There are no hypoglycemic complications. Symptoms are stable. Pertinent negatives for diabetic complications include no CVA, heart disease, nephropathy or peripheral neuropathy. Risk factors for coronary artery disease include dyslipidemia, diabetes mellitus and male sex. Current diabetic treatment includes insulin injections. He is compliant with treatment all of the time. He is following a generally healthy diet. His breakfast blood glucose range is generally 140-180 mg/dl. An ACE inhibitor/angiotensin II receptor blocker is not being taken. Eye exam is current (Pt has appt on 07/03/15).  Hyperlipidemia This is a chronic problem. The current episode started more than 1 year ago. The problem is uncontrolled. Recent lipid tests were reviewed and are high. Current antihyperlipidemic treatment includes diet change. The current treatment provides no improvement of lipids. Risk factors for coronary artery disease include dyslipidemia and male sex.      Review of Systems  Constitutional: Negative.   HENT: Negative.   Eyes: Negative for blurred vision.  Respiratory: Negative.   Cardiovascular: Negative.   Gastrointestinal: Negative.   Endocrine: Negative.   Genitourinary: Negative.   Musculoskeletal: Negative.   Neurological: Positive for dizziness.  Hematological: Negative.   Psychiatric/Behavioral: Positive for confusion.  All other systems reviewed and are negative.      Objective:   Physical Exam  Constitutional: He is oriented to person, place, and time. He appears  well-developed and well-nourished. No distress.  HENT:  Head: Normocephalic.  Right Ear: External ear normal.  Left Ear: External ear normal.  Nose: Nose normal.  Mouth/Throat: Oropharynx is clear and moist.  Eyes: Pupils are equal, round, and reactive to light. Right eye exhibits no discharge. Left eye exhibits no discharge.  Neck: Normal range of motion. Neck supple. No thyromegaly present.  Cardiovascular: Normal rate, regular rhythm, normal heart sounds and intact distal pulses.   No murmur heard. Pulmonary/Chest: Effort normal and breath sounds normal. No respiratory distress. He has no wheezes.  Abdominal: Soft. Bowel sounds are normal. He exhibits no distension. There is no tenderness.  Musculoskeletal: Normal range of motion. He exhibits no edema or tenderness.  Neurological: He is alert and oriented to person, place, and time. He has normal reflexes. No cranial nerve deficit.  Skin: Skin is warm and dry. No rash noted. No erythema.  Psychiatric: He has a normal mood and affect. His behavior is normal. Judgment and thought content normal.  Vitals reviewed.   BP 85/55 mmHg  Pulse 79  Temp(Src) 97 F (36.1 C) (Oral)  Ht 6' (1.829 m)  Wt 164 lb 12.8 oz (74.753 kg)  BMI 22.35 kg/m2       Assessment & Plan:  1. Type 1 diabetes mellitus with hyperglycemia (HCC) - Bayer DCA Hb A1c Waived - Microalbumin / creatinine urine ratio - CMP14+EGFR  2. Vitamin D deficiency - VITAMIN D 25 Hydroxy (Vit-D Deficiency, Fractures) - CMP14+EGFR  3. Elevated cholesterol with elevated triglycerides - Lipid panel - CMP14+EGFR   Continue all meds Labs pending Health Maintenance reviewed Diet and exercise encouraged RTO 4 months  Evelina Dun, FNP

## 2015-11-13 NOTE — Patient Instructions (Signed)

## 2015-11-14 LAB — CMP14+EGFR
ALBUMIN: 4.2 g/dL (ref 3.5–5.5)
ALK PHOS: 72 IU/L (ref 39–117)
ALT: 15 IU/L (ref 0–44)
AST: 19 IU/L (ref 0–40)
Albumin/Globulin Ratio: 1.6 (ref 1.2–2.2)
BILIRUBIN TOTAL: 0.6 mg/dL (ref 0.0–1.2)
BUN / CREAT RATIO: 14 (ref 9–20)
BUN: 17 mg/dL (ref 6–24)
CHLORIDE: 94 mmol/L — AB (ref 96–106)
CO2: 24 mmol/L (ref 18–29)
Calcium: 9 mg/dL (ref 8.7–10.2)
Creatinine, Ser: 1.18 mg/dL (ref 0.76–1.27)
GFR calc Af Amer: 87 mL/min/{1.73_m2} (ref >59)
GFR calc non Af Amer: 75 mL/min/{1.73_m2} (ref >59)
GLUCOSE: 270 mg/dL — AB (ref 65–99)
Globulin, Total: 2.7 g/dL (ref 1.5–4.5)
Potassium: 4.9 mmol/L (ref 3.5–5.2)
Sodium: 135 mmol/L (ref 134–144)
Total Protein: 6.9 g/dL (ref 6.0–8.5)

## 2015-11-14 LAB — MICROALBUMIN / CREATININE URINE RATIO
Creatinine, Urine: 123.6 mg/dL
MICROALB/CREAT RATIO: 2.5 mg/g creat (ref 0.0–30.0)
Microalbumin, Urine: 3.1 ug/mL

## 2015-11-14 LAB — LIPID PANEL
CHOL/HDL RATIO: 6.7 ratio — AB (ref 0.0–5.0)
Cholesterol, Total: 207 mg/dL — ABNORMAL HIGH (ref 100–199)
HDL: 31 mg/dL — ABNORMAL LOW (ref >39)
LDL CALC: 118 mg/dL — AB (ref 0–99)
Triglycerides: 290 mg/dL — ABNORMAL HIGH (ref 0–149)
VLDL CHOLESTEROL CAL: 58 mg/dL — AB (ref 5–40)

## 2015-11-14 LAB — VITAMIN D 25 HYDROXY (VIT D DEFICIENCY, FRACTURES): VIT D 25 HYDROXY: 18.3 ng/mL — AB (ref 30.0–100.0)

## 2015-11-15 ENCOUNTER — Other Ambulatory Visit: Payer: Self-pay | Admitting: Family

## 2015-11-15 MED ORDER — VITAMIN D (ERGOCALCIFEROL) 1.25 MG (50000 UNIT) PO CAPS: 50000.0000 [IU] | ORAL_CAPSULE | ORAL | Status: DC

## 2016-03-20 ENCOUNTER — Encounter: Payer: Self-pay | Admitting: Family

## 2016-03-20 ENCOUNTER — Ambulatory Visit (INDEPENDENT_AMBULATORY_CARE_PROVIDER_SITE_OTHER): Payer: BLUE CROSS/BLUE SHIELD | Admitting: Family

## 2016-03-20 VITALS — BP 92/62 | HR 78 | Temp 97.1°F | Ht 72.0 in | Wt 167.6 lb

## 2016-03-20 DIAGNOSIS — E559 Vitamin D deficiency, unspecified: Secondary | ICD-10-CM

## 2016-03-20 DIAGNOSIS — E782 Mixed hyperlipidemia: Secondary | ICD-10-CM

## 2016-03-20 DIAGNOSIS — E1065 Type 1 diabetes mellitus with hyperglycemia: Secondary | ICD-10-CM

## 2016-03-20 LAB — BAYER DCA HB A1C WAIVED: HB A1C (BAYER DCA - WAIVED): 7.7 % — ABNORMAL HIGH (ref <7.0)

## 2016-03-20 NOTE — Patient Instructions (Signed)

## 2016-03-20 NOTE — Progress Notes (Signed)
   Subjective:    Patient ID: [name], male    DOB: 14-Jan-1971, 45 y.o.   MRN: [id number]  Pt presents to the office today for chronic follow up.  Diabetes  He has type 1 diabetes mellitus. His disease course has been fluctuating. Hypoglycemia symptoms include dizziness. Pertinent negatives for hypoglycemia include no confusion. Pertinent negatives for diabetes include no blurred vision, no foot paresthesias, no foot ulcerations and no visual change. There are no hypoglycemic complications. Symptoms are stable. Pertinent negatives for diabetic complications include no CVA, heart disease, nephropathy or peripheral neuropathy. Risk factors for coronary artery disease include dyslipidemia, diabetes mellitus and male sex. Current diabetic treatment includes insulin injections. He is compliant with treatment all of the time. He is following a generally healthy diet. His breakfast blood glucose range is generally 140-180 mg/dl. An ACE inhibitor/angiotensin II receptor blocker is not being taken. Eye exam is current (Pt has appt on 07/03/15).  Hyperlipidemia  This is a chronic problem. The current episode started more than 1 year ago. The problem is uncontrolled. Recent lipid tests were reviewed and are high. Current antihyperlipidemic treatment includes diet change. The current treatment provides no improvement of lipids. Risk factors for coronary artery disease include dyslipidemia and male sex.      Review of Systems  Constitutional: Negative.   HENT: Negative.   Eyes: Negative for blurred vision.  Respiratory: Negative.   Cardiovascular: Negative.   Gastrointestinal: Negative.   Endocrine: Negative.   Genitourinary: Negative.   Musculoskeletal: Negative.   Neurological: Positive for dizziness.  Hematological: Negative.   Psychiatric/Behavioral: Negative for confusion.  All other systems reviewed and are negative.      Objective:   Physical Exam  Constitutional: He is oriented to  person, place, and time. He appears well-developed and well-nourished. No distress.  HENT:  Head: Normocephalic.  Right Ear: External ear normal.  Left Ear: External ear normal.  Nose: Nose normal.  Mouth/Throat: Oropharynx is clear and moist.  Eyes: Pupils are equal, round, and reactive to light. Right eye exhibits no discharge. Left eye exhibits no discharge.  Neck: Normal range of motion. Neck supple. No thyromegaly present.  Cardiovascular: Normal rate, regular rhythm, normal heart sounds and intact distal pulses.   No murmur heard. Pulmonary/Chest: Effort normal and breath sounds normal. No respiratory distress. He has no wheezes.  Abdominal: Soft. Bowel sounds are normal. He exhibits no distension. There is no tenderness.  Musculoskeletal: Normal range of motion. He exhibits no edema or tenderness.  Neurological: He is alert and oriented to person, place, and time. He has normal reflexes. No cranial nerve deficit.  Skin: Skin is warm and dry. No rash noted. No erythema.  Psychiatric: He has a normal mood and affect. His behavior is normal. Judgment and thought content normal.  Vitals reviewed.   BP 92/62   Pulse 78   Temp 97.1 F (36.2 C) (Oral)   Ht 6' (1.829 m)   Wt 167 lb 9.6 oz (76 kg)   BMI 22.73 kg/m        Assessment & Plan:  1. Type 1 diabetes mellitus with hyperglycemia (HCC) - CMP14+EGFR - Bayer DCA Hb A1c Waived  2. Elevated cholesterol with elevated triglycerides - CMP14+EGFR - Lipid panel  3. Vitamin D deficiency - CMP14+EGFR   Continue all meds Labs pending Health Maintenance reviewed Diet and exercise encouraged RTO 3 months   Evelina Dun, FNP

## 2016-03-21 ENCOUNTER — Other Ambulatory Visit: Payer: Self-pay | Admitting: Family

## 2016-03-21 LAB — CMP14+EGFR
A/G RATIO: 1.5 (ref 1.2–2.2)
ALBUMIN: 4.4 g/dL (ref 3.5–5.5)
ALK PHOS: 65 IU/L (ref 39–117)
ALT: 11 IU/L (ref 0–44)
AST: 24 IU/L (ref 0–40)
BILIRUBIN TOTAL: 0.6 mg/dL (ref 0.0–1.2)
BUN / CREAT RATIO: 19 (ref 9–20)
BUN: 21 mg/dL (ref 6–24)
CHLORIDE: 92 mmol/L — AB (ref 96–106)
CO2: 25 mmol/L (ref 18–29)
Calcium: 9.4 mg/dL (ref 8.7–10.2)
Creatinine, Ser: 1.09 mg/dL (ref 0.76–1.27)
GFR calc non Af Amer: 82 mL/min/{1.73_m2} (ref >59)
GFR, EST AFRICAN AMERICAN: 95 mL/min/{1.73_m2} (ref >59)
Globulin, Total: 2.9 g/dL (ref 1.5–4.5)
Glucose: 230 mg/dL — ABNORMAL HIGH (ref 65–99)
POTASSIUM: 4.7 mmol/L (ref 3.5–5.2)
Sodium: 132 mmol/L — ABNORMAL LOW (ref 134–144)
TOTAL PROTEIN: 7.3 g/dL (ref 6.0–8.5)

## 2016-03-21 LAB — LIPID PANEL
CHOL/HDL RATIO: 7.7 ratio — AB (ref 0.0–5.0)
Cholesterol, Total: 238 mg/dL — ABNORMAL HIGH (ref 100–199)
HDL: 31 mg/dL — ABNORMAL LOW (ref >39)
Triglycerides: 406 mg/dL — ABNORMAL HIGH (ref 0–149)

## 2016-03-21 MED ORDER — ROSUVASTATIN CALCIUM 20 MG PO TABS: 20.0000 mg | ORAL_TABLET | Freq: Every day | ORAL | 3 refills | Status: DC

## 2016-04-04 ENCOUNTER — Encounter: Payer: Self-pay | Admitting: Nurse Practitioner

## 2016-04-04 ENCOUNTER — Ambulatory Visit (INDEPENDENT_AMBULATORY_CARE_PROVIDER_SITE_OTHER): Payer: BLUE CROSS/BLUE SHIELD | Admitting: Nurse Practitioner

## 2016-04-04 VITALS — BP 104/67 | HR 86 | Temp 97.8°F | Ht 72.0 in | Wt 166.6 lb

## 2016-04-04 DIAGNOSIS — H938X1 Other specified disorders of right ear: Secondary | ICD-10-CM | POA: Diagnosis not present

## 2016-04-04 NOTE — Progress Notes (Signed)
   Subjective:    Patient ID: [name], male    DOB: January 26, 1971, 45 y.o.   MRN: [id number]  HPI  Patient comes in today C/O right ear feels clogged- started in July- he has been seen by C. Hawks and was given flonase- has not helped- has gotten worse in the past few days.   Review of Systems  Constitutional: Negative for chills and fever.  HENT: Positive for ear pain (right). Negative for congestion, sore throat and trouble swallowing.   Respiratory: Negative for cough.   Cardiovascular: Negative.   Gastrointestinal: Negative.   Genitourinary: Negative.   Neurological: Negative.   Psychiatric/Behavioral: Negative.   All other systems reviewed and are negative.      Objective:   Physical Exam  Constitutional: He appears well-developed and well-nourished. No distress.  HENT:  Right Ear: Hearing, tympanic membrane, external ear and ear canal normal.  Left Ear: Hearing, tympanic membrane, external ear and ear canal normal.  Nose: Nose normal.  Mouth/Throat: Uvula is midline, oropharynx is clear and moist and mucous membranes are normal.   BP 104/67 (BP Location: Left Arm, Patient Position: Sitting, Cuff Size: Normal)   Pulse 86   Temp 97.8 F (36.6 C) (Oral)   Ht 6' (1.829 m)   Wt 166 lb 9.6 oz (75.6 kg)   BMI 22.60 kg/m        Assessment & Plan:   1. Ear congestion, right    Orders Placed This Encounter  Procedures  . Ambulatory referral to ENT    Referral Priority:   Routine    Referral Type:   Consultation    Referral Reason:   Specialty Services Required    Requested Specialty:   Otolaryngology    Number of Visits Requested:   1   Continue flonase as rx along with OTC decongestant Force fluids RTO prn  Mary-Margaret Daphine DeutscherMartin, FNP

## 2016-04-04 NOTE — Addendum Note (Signed)
Addended by: Bennie PieriniMARTIN, MARY-MARGARET on: 04/04/2016 06:24 PM   Modules accepted: Orders

## 2016-04-16 ENCOUNTER — Telehealth: Payer: Self-pay

## 2016-04-17 NOTE — Telephone Encounter (Signed)
Patient aware of appt with ENT

## 2016-04-17 NOTE — Telephone Encounter (Signed)
Need to know what wants referral for.

## 2016-04-24 ENCOUNTER — Ambulatory Visit (INDEPENDENT_AMBULATORY_CARE_PROVIDER_SITE_OTHER): Payer: BLUE CROSS/BLUE SHIELD | Admitting: Otolaryngology

## 2016-04-24 DIAGNOSIS — H6983 Other specified disorders of Eustachian tube, bilateral: Secondary | ICD-10-CM

## 2016-04-24 DIAGNOSIS — H9011 Conductive hearing loss, unilateral, right ear, with unrestricted hearing on the contralateral side: Secondary | ICD-10-CM

## 2016-04-24 DIAGNOSIS — H748X1 Other specified disorders of right middle ear and mastoid: Secondary | ICD-10-CM | POA: Diagnosis not present

## 2016-04-25 ENCOUNTER — Other Ambulatory Visit: Payer: Self-pay

## 2016-04-25 DIAGNOSIS — E10649 Type 1 diabetes mellitus with hypoglycemia without coma: Secondary | ICD-10-CM

## 2016-04-25 DIAGNOSIS — E10641 Type 1 diabetes mellitus with hypoglycemia with coma: Secondary | ICD-10-CM

## 2016-04-25 MED ORDER — GLUCOSE BLOOD VI STRP
ORAL_STRIP | 2 refills | Status: DC
Start: 2016-04-25 — End: 2016-08-20

## 2016-04-28 ENCOUNTER — Other Ambulatory Visit (INDEPENDENT_AMBULATORY_CARE_PROVIDER_SITE_OTHER): Payer: Self-pay | Admitting: Otolaryngology

## 2016-04-28 DIAGNOSIS — M5440 Lumbago with sciatica, unspecified side: Secondary | ICD-10-CM

## 2016-05-09 ENCOUNTER — Other Ambulatory Visit (INDEPENDENT_AMBULATORY_CARE_PROVIDER_SITE_OTHER): Payer: Self-pay | Admitting: Otolaryngology

## 2016-05-09 DIAGNOSIS — H748X1 Other specified disorders of right middle ear and mastoid: Secondary | ICD-10-CM

## 2016-05-12 ENCOUNTER — Ambulatory Visit (HOSPITAL_COMMUNITY)
Admission: RE | Admit: 2016-05-12 | Discharge: 2016-05-12 | Disposition: A | Discharge status: Home / Self Care | Payer: BLUE CROSS/BLUE SHIELD | Source: Ambulatory Visit | Attending: Otolaryngology | Admitting: Otolaryngology

## 2016-05-12 DIAGNOSIS — H748X1 Other specified disorders of right middle ear and mastoid: Secondary | ICD-10-CM | POA: Diagnosis not present

## 2016-05-12 DIAGNOSIS — H9221 Otorrhagia, right ear: Secondary | ICD-10-CM | POA: Diagnosis not present

## 2016-06-12 ENCOUNTER — Ambulatory Visit (INDEPENDENT_AMBULATORY_CARE_PROVIDER_SITE_OTHER): Payer: BLUE CROSS/BLUE SHIELD | Admitting: Otolaryngology

## 2016-06-18 ENCOUNTER — Ambulatory Visit: Payer: BLUE CROSS/BLUE SHIELD | Admitting: Family

## 2016-07-01 ENCOUNTER — Encounter: Payer: Self-pay | Admitting: Family

## 2016-07-01 ENCOUNTER — Ambulatory Visit (INDEPENDENT_AMBULATORY_CARE_PROVIDER_SITE_OTHER): Payer: BLUE CROSS/BLUE SHIELD | Admitting: Family

## 2016-07-01 VITALS — BP 84/60 | HR 89 | Temp 97.5°F | Ht 72.0 in | Wt 170.4 lb

## 2016-07-01 DIAGNOSIS — E559 Vitamin D deficiency, unspecified: Secondary | ICD-10-CM

## 2016-07-01 DIAGNOSIS — E1065 Type 1 diabetes mellitus with hyperglycemia: Secondary | ICD-10-CM | POA: Diagnosis not present

## 2016-07-01 DIAGNOSIS — E782 Mixed hyperlipidemia: Secondary | ICD-10-CM | POA: Diagnosis not present

## 2016-07-01 LAB — BAYER DCA HB A1C WAIVED: HB A1C: 7.8 % — AB (ref <7.0)

## 2016-07-01 NOTE — Patient Instructions (Signed)
Fat and Cholesterol Restricted Diet High levels of fat and cholesterol in your blood may lead to various health problems, such as diseases of the heart, blood vessels, gallbladder, liver, and pancreas. Fats are concentrated sources of energy that come in various forms. Certain types of fat, including saturated fat, may be harmful in excess. Cholesterol is a substance needed by your body in small amounts. Your body makes all the cholesterol it needs. Excess cholesterol comes from the food you eat. When you have high levels of cholesterol and saturated fat in your blood, health problems can develop because the excess fat and cholesterol will gather along the walls of your blood vessels, causing them to narrow. Choosing the right foods will help you control your intake of fat and cholesterol. This will help keep the levels of these substances in your blood within normal limits and reduce your risk of disease. What is my plan? Your health care provider recommends that you:  Limit your fat intake to ______% or less of your total calories per day.  Limit the amount of cholesterol in your diet to less than _________mg per day.  Eat 20-30 grams of fiber each day.  What types of fat should I choose?  Choose healthy fats more often. Choose monounsaturated and polyunsaturated fats, such as olive and canola oil, flaxseeds, walnuts, almonds, and seeds.  Eat more omega-3 fats. Good choices include salmon, mackerel, sardines, tuna, flaxseed oil, and ground flaxseeds. Aim to eat fish at least two times a week.  Limit saturated fats. Saturated fats are primarily found in animal products, such as meats, butter, and cream. Plant sources of saturated fats include palm oil, palm kernel oil, and coconut oil.  Avoid foods with partially hydrogenated oils in them. These contain trans fats. Examples of foods that contain trans fats are stick margarine, some tub margarines, cookies, crackers, and other baked goods. What  general guidelines do I need to follow? These guidelines for healthy eating will help you control your intake of fat and cholesterol:  Check food labels carefully to identify foods with trans fats or high amounts of saturated fat.  Fill one half of your plate with vegetables and green salads.  Fill one fourth of your plate with whole grains. Look for the word "whole" as the first word in the ingredient list.  Fill one fourth of your plate with lean protein foods.  Limit fruit to two servings a day. Choose fruit instead of juice.  Eat more foods that contain fiber, such as apples, broccoli, carrots, beans, peas, and barley.  Eat more home-cooked food and less restaurant, buffet, and fast food.  Limit or avoid alcohol.  Limit foods high in starch and sugar.  Limit fried foods.  Cook foods using methods other than frying. Baking, boiling, grilling, and broiling are all great options.  Lose weight if you are overweight. Losing just 5-10% of your initial body weight can help your overall health and prevent diseases such as diabetes and heart disease.  What foods can I eat? Grains  Whole grains, such as whole wheat or whole grain breads, crackers, cereals, and pasta. Unsweetened oatmeal, bulgur, barley, quinoa, or brown rice. Corn or whole wheat flour tortillas. Vegetables  Fresh or frozen vegetables (raw, steamed, roasted, or grilled). Green salads. Fruits  All fresh, canned (in natural juice), or frozen fruits. Meats and other protein foods  Ground beef (85% or leaner), grass-fed beef, or beef trimmed of fat. Skinless chicken or turkey. Ground chicken or turkey.   Pork trimmed of fat. All fish and seafood. Eggs. Dried beans, peas, or lentils. Unsalted nuts or seeds. Unsalted canned or dry beans. Dairy  Low-fat dairy products, such as skim or 1% milk, 2% or reduced-fat cheeses, low-fat ricotta or cottage cheese, or plain low-fat yo Fats and oils  Tub margarines without trans  fats. Light or reduced-fat mayonnaise and salad dressings. Avocado. Olive, canola, sesame, or safflower oils. Natural peanut or almond butter (choose ones without added sugar and oil). The items listed above may not be a complete list of recommended foods or beverages. Contact your dietitian for more options. Foods to avoid Grains  White bread. White pasta. White rice. Cornbread. Bagels, pastries, and croissants. Crackers that contain trans fat. Vegetables  White potatoes. Corn. Creamed or fried vegetables. Vegetables in a cheese sauce. Fruits  Dried fruits. Canned fruit in light or heavy syrup. Fruit juice. Meats and other protein foods  Fatty cuts of meat. Ribs, chicken wings, bacon, sausage, bologna, salami, chitterlings, fatback, hot dogs, bratwurst, and packaged luncheon meats. Liver and organ meats. Dairy  Whole or 2% milk, cream, half-and-half, and cream cheese. Whole milk cheeses. Whole-fat or sweetened yogurt. Full-fat cheeses. Nondairy creamers and whipped toppings. Processed cheese, cheese spreads, or cheese curds. Beverages  Alcohol. Sweetened drinks (such as sodas, lemonade, and fruit drinks or punches). Fats and oils  Butter, stick margarine, lard, shortening, ghee, or bacon fat. Coconut, palm kernel, or palm oils. Sweets and desserts  Corn syrup, sugars, honey, and molasses. Candy. Jam and jelly. Syrup. Sweetened cereals. Cookies, pies, cakes, donuts, muffins, and ice cream. The items listed above may not be a complete list of foods and beverages to avoid. Contact your dietitian for more information. This information is not intended to replace advice given to you by your health care provider. Make sure you discuss any questions you have with your health care provider. Document Released: 07/14/2005 Document Revised: 08/04/2014 Document Reviewed: 10/12/2013 Elsevier Interactive Patient Education  2017 Elsevier Inc.  

## 2016-07-01 NOTE — Progress Notes (Signed)
   Subjective:    Patient ID: [name], male    DOB: 01/01/1971, 45 y.o.   MRN: [id number]  Pt presents to the office today for chronic follow up.  Diabetes  He has type 1 diabetes mellitus. His disease course has been fluctuating. Hypoglycemia symptoms include dizziness. Pertinent negatives for hypoglycemia include no confusion. Pertinent negatives for diabetes include no blurred vision, no foot paresthesias, no foot ulcerations and no visual change. There are no hypoglycemic complications. Symptoms are stable. Pertinent negatives for diabetic complications include no CVA, heart disease, nephropathy or peripheral neuropathy. Risk factors for coronary artery disease include dyslipidemia, diabetes mellitus and male sex. Current diabetic treatment includes insulin injections. He is compliant with treatment all of the time. He is following a generally healthy diet. His breakfast blood glucose range is generally 140-180 mg/dl. An ACE inhibitor/angiotensin II receptor blocker is not being taken. Eye exam is current (07/03/15).  Hyperlipidemia  This is a chronic problem. The current episode started more than 1 year ago. The problem is uncontrolled. Recent lipid tests were reviewed and are high. Current antihyperlipidemic treatment includes statins. The current treatment provides no improvement of lipids. Risk factors for coronary artery disease include dyslipidemia and male sex.      Review of Systems  Constitutional: Negative.   HENT: Negative.   Eyes: Negative for blurred vision.  Respiratory: Negative.   Cardiovascular: Negative.   Gastrointestinal: Negative.   Endocrine: Negative.   Genitourinary: Negative.   Musculoskeletal: Negative.   Neurological: Positive for dizziness.  Hematological: Negative.   Psychiatric/Behavioral: Negative for confusion.  All other systems reviewed and are negative.      Objective:   Physical Exam  Constitutional: He is oriented to person, place, and  time. He appears well-developed and well-nourished. No distress.  HENT:  Head: Normocephalic.  Right Ear: External ear normal.  Left Ear: External ear normal.  Nose: Nose normal.  Mouth/Throat: Oropharynx is clear and moist.  Eyes: Pupils are equal, round, and reactive to light. Right eye exhibits no discharge. Left eye exhibits no discharge.  Neck: Normal range of motion. Neck supple. No thyromegaly present.  Cardiovascular: Normal rate, regular rhythm, normal heart sounds and intact distal pulses.   No murmur heard. Pulmonary/Chest: Effort normal and breath sounds normal. No respiratory distress. He has no wheezes.  Abdominal: Soft. Bowel sounds are normal. He exhibits no distension. There is no tenderness.  Musculoskeletal: Normal range of motion. He exhibits no edema or tenderness.  Neurological: He is alert and oriented to person, place, and time. He has normal reflexes. No cranial nerve deficit.  Skin: Skin is warm and dry. No rash noted. No erythema.  Psychiatric: He has a normal mood and affect. His behavior is normal. Judgment and thought content normal.  Vitals reviewed.   BP (!) 84/60   Pulse 89   Temp 97.5 F (36.4 C) (Oral)   Ht 6' (1.829 m)   Wt 170 lb 6.4 oz (77.3 kg)   BMI 23.11 kg/m        Assessment & Plan:  1. Type 1 diabetes mellitus with hyperglycemia (HCC) - Bayer DCA Hb A1c Waived - CMP14+EGFR  2. Elevated cholesterol with elevated triglycerides - CMP14+EGFR - Lipid panel  3. Vitamin D deficiency - CMP14+EGFR - VITAMIN D 25 Hydroxy (Vit-D Deficiency, Fractures)   Continue all meds Labs pending Health Maintenance reviewed Diet and exercise encouraged RTO 4 months   Evelina Dun, FNP

## 2016-07-02 LAB — LIPID PANEL
CHOLESTEROL TOTAL: 105 mg/dL (ref 100–199)
Chol/HDL Ratio: 2.8 ratio units (ref 0.0–5.0)
HDL: 37 mg/dL — ABNORMAL LOW (ref >39)
LDL Calculated: 38 mg/dL (ref 0–99)
Triglycerides: 151 mg/dL — ABNORMAL HIGH (ref 0–149)
VLDL CHOLESTEROL CAL: 30 mg/dL (ref 5–40)

## 2016-07-02 LAB — CMP14+EGFR
A/G RATIO: 1.5 (ref 1.2–2.2)
ALBUMIN: 4.2 g/dL (ref 3.5–5.5)
ALK PHOS: 65 IU/L (ref 39–117)
ALT: 17 IU/L (ref 0–44)
AST: 29 IU/L (ref 0–40)
BILIRUBIN TOTAL: 0.6 mg/dL (ref 0.0–1.2)
BUN / CREAT RATIO: 16 (ref 9–20)
BUN: 19 mg/dL (ref 6–24)
CHLORIDE: 99 mmol/L (ref 96–106)
CO2: 28 mmol/L (ref 18–29)
Calcium: 9.4 mg/dL (ref 8.7–10.2)
Creatinine, Ser: 1.2 mg/dL (ref 0.76–1.27)
GFR calc non Af Amer: 73 mL/min/{1.73_m2} (ref >59)
GFR, EST AFRICAN AMERICAN: 85 mL/min/{1.73_m2} (ref >59)
GLOBULIN, TOTAL: 2.8 g/dL (ref 1.5–4.5)
GLUCOSE: 143 mg/dL — AB (ref 65–99)
POTASSIUM: 5.4 mmol/L — AB (ref 3.5–5.2)
SODIUM: 138 mmol/L (ref 134–144)
TOTAL PROTEIN: 7 g/dL (ref 6.0–8.5)

## 2016-07-02 LAB — VITAMIN D 25 HYDROXY (VIT D DEFICIENCY, FRACTURES): VIT D 25 HYDROXY: 55.3 ng/mL (ref 30.0–100.0)

## 2016-07-16 ENCOUNTER — Other Ambulatory Visit: Payer: Self-pay | Admitting: Family

## 2016-07-16 MED ORDER — INSULIN LISPRO 100 UNIT/ML (KWIKPEN): PEN_INJECTOR | SUBCUTANEOUS | 5 refills | Status: DC

## 2016-07-16 NOTE — Telephone Encounter (Signed)
done

## 2016-07-17 ENCOUNTER — Telehealth: Payer: Self-pay

## 2016-07-17 MED ORDER — INSULIN ASPART 100 UNIT/ML FLEXPEN: PEN_INJECTOR | SUBCUTANEOUS | 11 refills | Status: DC

## 2016-07-17 NOTE — Telephone Encounter (Signed)
Humalog switched to Novolog per insurance

## 2016-07-17 NOTE — Telephone Encounter (Signed)
Detailed message left for patient.

## 2016-08-20 ENCOUNTER — Other Ambulatory Visit: Payer: Self-pay

## 2016-08-20 DIAGNOSIS — E10649 Type 1 diabetes mellitus with hypoglycemia without coma: Secondary | ICD-10-CM

## 2016-08-20 DIAGNOSIS — E10641 Type 1 diabetes mellitus with hypoglycemia with coma: Secondary | ICD-10-CM

## 2016-08-20 MED ORDER — GLUCOSE BLOOD VI STRP: ORAL_STRIP | 2 refills | Status: DC

## 2016-10-06 ENCOUNTER — Telehealth: Payer: Self-pay | Admitting: Family

## 2016-10-06 MED ORDER — OSELTAMIVIR PHOSPHATE 75 MG PO CAPS: 75.0000 mg | ORAL_CAPSULE | Freq: Two times a day (BID) | ORAL | 0 refills | Status: DC

## 2016-10-06 NOTE — Telephone Encounter (Signed)
Patient notified that Tamiflu was sent to pharmacy.

## 2016-10-06 NOTE — Telephone Encounter (Signed)
What symptoms do you have? Flu. Exposed to flu a by his children  How long have you been sick? yesterday  Have you been seen for this problem? No, wants tamiflu  If your provider decides to give you a prescription, which pharmacy would you like for it to be sent to? [facility] in [place].   Patient informed that this information will be sent to the clinical staff for review and that they should receive a follow up call.

## 2016-10-09 ENCOUNTER — Telehealth: Payer: Self-pay | Admitting: *Deleted

## 2016-10-09 ENCOUNTER — Ambulatory Visit (INDEPENDENT_AMBULATORY_CARE_PROVIDER_SITE_OTHER): Payer: BLUE CROSS/BLUE SHIELD | Admitting: Family

## 2016-10-09 ENCOUNTER — Encounter: Payer: Self-pay | Admitting: Family

## 2016-10-09 ENCOUNTER — Ambulatory Visit (INDEPENDENT_AMBULATORY_CARE_PROVIDER_SITE_OTHER): Payer: BLUE CROSS/BLUE SHIELD

## 2016-10-09 VITALS — BP 93/58 | HR 95 | Temp 97.2°F | Ht 72.0 in | Wt 162.8 lb

## 2016-10-09 DIAGNOSIS — E1065 Type 1 diabetes mellitus with hyperglycemia: Secondary | ICD-10-CM | POA: Diagnosis not present

## 2016-10-09 DIAGNOSIS — J209 Acute bronchitis, unspecified: Secondary | ICD-10-CM

## 2016-10-09 DIAGNOSIS — R0602 Shortness of breath: Secondary | ICD-10-CM

## 2016-10-09 DIAGNOSIS — R6889 Other general symptoms and signs: Secondary | ICD-10-CM

## 2016-10-09 DIAGNOSIS — I959 Hypotension, unspecified: Secondary | ICD-10-CM

## 2016-10-09 DIAGNOSIS — R531 Weakness: Secondary | ICD-10-CM | POA: Diagnosis not present

## 2016-10-09 MED ORDER — AZITHROMYCIN 250 MG PO TABS: ORAL_TABLET | ORAL | 0 refills | Status: DC

## 2016-10-09 MED ORDER — BENZONATATE 200 MG PO CAPS: 200.0000 mg | ORAL_CAPSULE | Freq: Three times a day (TID) | ORAL | 1 refills | Status: DC | PRN

## 2016-10-09 NOTE — Telephone Encounter (Signed)
Work note faxed Pt notified

## 2016-10-09 NOTE — Progress Notes (Signed)
   Subjective:    Patient ID: [name], male    DOB: 1971-01-16, 46 y.o.   MRN: [id number]  Pt was exposed to the Flu and was started on Tamiflu on Monday. Pt started coughing and feeling fatigue on Tuesday that seems worse.   Cough  The current episode started in the past 7 days. The problem has been gradually worsening. The problem occurs every few minutes. The cough is non-productive. Associated symptoms include a fever, headaches, myalgias, nasal congestion, postnasal drip, a sore throat, shortness of breath and wheezing. Pertinent negatives include no chills, ear congestion or ear pain. The symptoms are aggravated by lying down. He has tried rest (tamiflu) for the symptoms. The treatment provided mild relief. There is no history of asthma or COPD.      Review of Systems  Constitutional: Positive for fever. Negative for chills.  HENT: Positive for postnasal drip and sore throat. Negative for ear pain.   Respiratory: Positive for cough, shortness of breath and wheezing.   Musculoskeletal: Positive for myalgias.  Neurological: Positive for headaches.  All other systems reviewed and are negative.      Objective:   Physical Exam  Constitutional: He is oriented to person, place, and time. He appears well-developed and well-nourished. He appears ill. No distress.  HENT:  Head: Normocephalic.  Right Ear: External ear normal.  Left Ear: External ear normal.  Nose: Mucosal edema and rhinorrhea present.  Mouth/Throat: Posterior oropharyngeal erythema present.  Eyes: Pupils are equal, round, and reactive to light. Right eye exhibits no discharge. Left eye exhibits no discharge.  Neck: Normal range of motion. Neck supple. No thyromegaly present.  Cardiovascular: Normal rate, regular rhythm, normal heart sounds and intact distal pulses.   No murmur heard. Pulmonary/Chest: Effort normal. No respiratory distress. He has decreased breath sounds. He has no wheezes.  Abdominal: Soft. Bowel  sounds are normal. He exhibits no distension. There is no tenderness.  Musculoskeletal: Normal range of motion. He exhibits no edema or tenderness.  Neurological: He is alert and oriented to person, place, and time. He has normal reflexes. No cranial nerve deficit.  Skin: Skin is warm and dry. No rash noted. No erythema.  Psychiatric: He has a normal mood and affect. His behavior is normal. Judgment and thought content normal.  Vitals reviewed.     BP (!) 93/58   Pulse 95   Temp 97.2 F (36.2 C) (Oral)   Ht 6' (1.829 m)   Wt 162 lb 12.8 oz (73.8 kg)   BMI 22.08 kg/m      Assessment & Plan:  1. Flu-like symptoms - DG Chest 2 View; Future  2. SOB (shortness of breath) - DG Chest 2 View; Future  3. Acute bronchitis, unspecified organism - DG Chest 2 View; Future - azithromycin (ZITHROMAX) 250 MG tablet; Take 500 mg once, then 250 mg for four days  Dispense: 6 tablet; Refill: 0 - benzonatate (TESSALON) 200 MG capsule; Take 1 capsule (200 mg total) by mouth 3 (three) times daily as needed.  Dispense: 30 capsule; Refill: 1  4. Hypotension, unspecified hypotension typ  5. Weakness  6. Type 1 diabetes mellitus with hyperglycemia (HCC)   Continue tamiflu Force fluids!!  Will start zpak today If symptoms worsen call office, follow up on 10/13/16 Blood glucose stable at this time  Jannifer Rodneyhristy Jeree Delcid, FNP

## 2016-10-09 NOTE — Telephone Encounter (Signed)
Can write pt note to return on Monday and for whatever days he missed this week.

## 2016-10-09 NOTE — Telephone Encounter (Signed)
Pt called in to request work note be faxed to [phone] Please advise how long pt should be out of work

## 2016-10-09 NOTE — Patient Instructions (Signed)

## 2016-10-13 ENCOUNTER — Ambulatory Visit (INDEPENDENT_AMBULATORY_CARE_PROVIDER_SITE_OTHER): Payer: BLUE CROSS/BLUE SHIELD | Admitting: Family

## 2016-11-04 ENCOUNTER — Ambulatory Visit (INDEPENDENT_AMBULATORY_CARE_PROVIDER_SITE_OTHER): Payer: BLUE CROSS/BLUE SHIELD | Admitting: Family

## 2016-11-04 ENCOUNTER — Encounter: Payer: Self-pay | Admitting: Family

## 2016-11-04 VITALS — BP 100/63 | HR 79 | Temp 97.4°F | Ht 72.0 in | Wt 163.4 lb

## 2016-11-04 DIAGNOSIS — E559 Vitamin D deficiency, unspecified: Secondary | ICD-10-CM

## 2016-11-04 DIAGNOSIS — E782 Mixed hyperlipidemia: Secondary | ICD-10-CM

## 2016-11-04 DIAGNOSIS — E1065 Type 1 diabetes mellitus with hyperglycemia: Secondary | ICD-10-CM

## 2016-11-04 LAB — BAYER DCA HB A1C WAIVED: HB A1C: 7.8 % — AB (ref <7.0)

## 2016-11-04 NOTE — Progress Notes (Signed)
   Subjective:    Patient ID: [name], male    DOB: 10-Dec-1970, 46 y.o.   MRN: [id number]  Pt presents to the office today for chronic follow up.  Diabetes  He has type 1 diabetes mellitus. His disease course has been fluctuating. Hypoglycemia symptoms include dizziness. Pertinent negatives for hypoglycemia include no confusion. Pertinent negatives for diabetes include no blurred vision, no foot paresthesias, no foot ulcerations and no visual change. There are no hypoglycemic complications. Symptoms are stable. Pertinent negatives for diabetic complications include no CVA, heart disease, nephropathy or peripheral neuropathy. Risk factors for coronary artery disease include dyslipidemia, diabetes mellitus and male sex. Current diabetic treatment includes insulin injections. He is compliant with treatment all of the time. He is following a generally healthy diet. His breakfast blood glucose range is generally 180-200 mg/dl. An ACE inhibitor/angiotensin II receptor blocker is not being taken. Eye exam is current (07/03/15).  Hyperlipidemia  This is a chronic problem. The current episode started more than 1 year ago. The problem is controlled. Recent lipid tests were reviewed and are normal. He has no history of obesity. Current antihyperlipidemic treatment includes statins. The current treatment provides no improvement of lipids. Risk factors for coronary artery disease include dyslipidemia and male sex.      Review of Systems  Constitutional: Negative.   HENT: Negative.   Eyes: Negative for blurred vision.  Respiratory: Negative.   Cardiovascular: Negative.   Gastrointestinal: Negative.   Endocrine: Negative.   Genitourinary: Negative.   Musculoskeletal: Negative.   Neurological: Positive for dizziness.  Hematological: Negative.   Psychiatric/Behavioral: Negative for confusion.  All other systems reviewed and are negative.      Objective:   Physical Exam  Constitutional: He is  oriented to person, place, and time. He appears well-developed and well-nourished. No distress.  HENT:  Head: Normocephalic.  Right Ear: External ear normal.  Left Ear: External ear normal.  Nose: Nose normal.  Mouth/Throat: Oropharynx is clear and moist.  Eyes: Pupils are equal, round, and reactive to light. Right eye exhibits no discharge. Left eye exhibits no discharge.  Neck: Normal range of motion. Neck supple. No thyromegaly present.  Cardiovascular: Normal rate, regular rhythm, normal heart sounds and intact distal pulses.   No murmur heard. Pulmonary/Chest: Effort normal and breath sounds normal. No respiratory distress. He has no wheezes.  Abdominal: Soft. Bowel sounds are normal. He exhibits no distension. There is no tenderness.  Musculoskeletal: Normal range of motion. He exhibits no edema or tenderness.  Neurological: He is alert and oriented to person, place, and time. He has normal reflexes. No cranial nerve deficit.  Skin: Skin is warm and dry. No rash noted. No erythema.  Psychiatric: He has a normal mood and affect. His behavior is normal. Judgment and thought content normal.  Vitals reviewed.   BP 100/63   Pulse 79   Temp 97.4 F (36.3 C) (Oral)   Ht 6' (1.829 m)   Wt 163 lb 6.4 oz (74.1 kg)   BMI 22.16 kg/m        Assessment & Plan:  1. Type 1 diabetes mellitus with hyperglycemia (HCC) PT to schedule opthalmology exam Low carb diet - CMP14+EGFR - Bayer DCA Hb A1c Waived  2. Vitamin D deficiency - CMP14+EGFR  3. Elevated cholesterol with elevated triglycerides - CMP14+EGFR - Lipid panel    Continue all meds Labs pending Health Maintenance reviewed Diet and exercise encouraged RTO 4 months   Evelina Dun, FNP

## 2016-11-04 NOTE — Patient Instructions (Signed)
Type 1 Diabetes Mellitus, Diagnosis, Adult Type 1 diabetes (type 1 diabetes mellitus) is a long-term (chronic) disease. It occurs when the pancreas does not make enough of a hormone called insulin. Normally, insulin allows sugars (glucose) to enter cells in the body. The cells use glucose for energy. Lack of insulin causes excess glucose to build up in the blood instead of going into cells. As a result, high blood glucose (hyperglycemia) develops. The exact cause of type 1 diabetes is not known. There is currently no cure for type 1 diabetes, but it can be managed with insulin treatment and lifestyle changes. What increases the risk? You may be more likely to develop this condition if you have a family member who has type 1 diabetes. Other factors may also make you more likely to develop type 1 diabetes, such as:  Having a gene for type 1 diabetes that is passed along from parent to child (inherited).  Living in an area with cold weather conditions.  Exposure to certain viruses.  Certain conditions in which the body's disease-fighting (immune) system attacks itself (autoimmune disorders).  What are the signs or symptoms? Symptoms may develop gradually, over days or weeks, or they may develop suddenly. Symptoms may include:  Increased thirst (polydipsia).  Increased hunger(polyphagia).  Increased urination (polyuria).  Increased urination during the night (nocturia).  Sudden or unexplained weight changes.  Frequent infections that keep coming back (recurring).  Fatigue.  Weakness.  Vision changes, such as blurry vision.  Fruity-smelling breath.  Cuts or bruises that are slow to heal.  Tingling or numbness in the hands or feet.  How is this diagnosed?  This condition is diagnosed based on your symptoms, your medical history, a physical exam, and your blood glucose level. Your blood glucose may be checked with one or more of the following blood tests:  A fasting blood  glucose (FBG) test. You will not be allowed to eat (you will fast) for at least 8 hours before a blood sample is taken.  A random blood glucose test. This checks blood glucose at any time of day regardless of when you ate.  An A1c (hemoglobin A1c) blood test. This provides information about blood glucose control over the previous 2-3 months.  You may be diagnosed with type 1 diabetes if:  Your FBG level is 126 mg/dL (7.0 mmol/L) or higher.  Your random blood glucose level is 200 mg/dL (11.1 mmol/L) or higher.  Your A1c level is 6.5% or higher.  These blood tests may be repeated to confirm your diagnosis. How is this treated? Your treatment may be managed by a specialist called an endocrinologist. Type 1 diabetes can be managed by following instructions from your health care provider about:  Taking insulin daily. This helps to keep your blood glucose levels in the healthy range. ? You may need to adjust your insulin dosage based on how physically active you are and what foods you eat. Your health care provider will tell you how to do this.  Taking medicines to help prevent complications from diabetes, such as: ? Aspirin. ? Medicine to lower cholesterol. ? Medicine to control blood pressure.  Checking your blood glucose as often as directed.  Making diet and lifestyle changes. These may include: ? Following an individualized nutrition plan that is developed by a diet and nutrition specialist (registered dietitian). ? Exercising regularly. ? Finding ways to manage stress.  Your health care provider will set individualized treatment goals for you. Your goals will be based on   your age, other medical conditions you have, and how you respond to diabetes treatment. Generally, the goal of treatment is to maintain the following blood glucose levels:  Before meals (preprandial): 80-130 mg/dL (4.4-7.2 mmol/L).  After meals (postprandial): below 180 mg/dL (10 mmol/L).  A1c level: less than  7%.  Follow these instructions at home: Questions to Ask Your Health Care Provider   Consider asking the following questions: ? Do I need to meet with a diabetes educator? ? Should I consider joining a support group for people with diabetes? ? What equipment will I need to manage my diabetes at home? ? What diabetes medicines should I take, and when? ? How often should I check my blood glucose? ? What number should I call if I have questions? ? When is my next appointment? General instructions  Take over-the-counter and prescription medicines only as told by your health care provider.  Keep all follow-up visits as told by your health care provider. This is important.  For more information about diabetes, visit: ? American Diabetes Association (ADA): www.diabetes.org ? American Association of Diabetes Educators (AADE): www.diabeteseducator.org/patient-resources Contact a health care provider if:  Your blood glucose level is higher than 240 mg/dL (13.3 mmol/L) for 2 days in a row.  You have been sick or have had a fever for 2 days or more and you are not getting better.  You have any of the following problems for more than 6 hours: ? You cannot eat or drink. ? You have nausea and vomiting. ? You have diarrhea. Get help right away if:  Your blood glucose is below 54 mg/dL (3 mmol/L).  You become confused or you have trouble thinking clearly.  You have difficulty breathing.  You have moderate or large ketone levels in your urine. This information is not intended to replace advice given to you by your health care provider. Make sure you discuss any questions you have with your health care provider. Document Released: 07/11/2000 Document Revised: 12/20/2015 Document Reviewed: 08/17/2015 Elsevier Interactive Patient Education  2017 [employer] Inc.  

## 2016-11-05 LAB — CMP14+EGFR
A/G RATIO: 1.5 (ref 1.2–2.2)
ALBUMIN: 4 g/dL (ref 3.5–5.5)
ALT: 28 IU/L (ref 0–44)
AST: 25 IU/L (ref 0–40)
Alkaline Phosphatase: 90 IU/L (ref 39–117)
BILIRUBIN TOTAL: 0.4 mg/dL (ref 0.0–1.2)
BUN / CREAT RATIO: 18 (ref 9–20)
BUN: 18 mg/dL (ref 6–24)
CALCIUM: 8.8 mg/dL (ref 8.7–10.2)
CO2: 26 mmol/L (ref 18–29)
Chloride: 96 mmol/L (ref 96–106)
Creatinine, Ser: 0.99 mg/dL (ref 0.76–1.27)
GFR, EST AFRICAN AMERICAN: 107 mL/min/{1.73_m2} (ref >59)
GFR, EST NON AFRICAN AMERICAN: 92 mL/min/{1.73_m2} (ref >59)
GLOBULIN, TOTAL: 2.7 g/dL (ref 1.5–4.5)
Glucose: 251 mg/dL — ABNORMAL HIGH (ref 65–99)
POTASSIUM: 4.5 mmol/L (ref 3.5–5.2)
SODIUM: 135 mmol/L (ref 134–144)
Total Protein: 6.7 g/dL (ref 6.0–8.5)

## 2016-11-05 LAB — LIPID PANEL
CHOL/HDL RATIO: 3.3 ratio (ref 0.0–5.0)
Cholesterol, Total: 119 mg/dL (ref 100–199)
HDL: 36 mg/dL — ABNORMAL LOW (ref >39)
LDL Calculated: 45 mg/dL (ref 0–99)
Triglycerides: 190 mg/dL — ABNORMAL HIGH (ref 0–149)
VLDL Cholesterol Cal: 38 mg/dL (ref 5–40)

## 2016-12-15 ENCOUNTER — Other Ambulatory Visit: Payer: Self-pay | Admitting: Family

## 2017-02-17 ENCOUNTER — Other Ambulatory Visit: Payer: Self-pay | Admitting: Family

## 2017-02-19 ENCOUNTER — Other Ambulatory Visit: Payer: Self-pay | Admitting: *Deleted

## 2017-02-19 DIAGNOSIS — E10649 Type 1 diabetes mellitus with hypoglycemia without coma: Secondary | ICD-10-CM

## 2017-02-19 DIAGNOSIS — E10641 Type 1 diabetes mellitus with hypoglycemia with coma: Secondary | ICD-10-CM

## 2017-02-19 MED ORDER — GLUCOSE BLOOD VI STRP: ORAL_STRIP | 2 refills | Status: DC

## 2017-02-19 NOTE — Telephone Encounter (Signed)
Last Vit D 07/01/16   55.3

## 2017-03-09 ENCOUNTER — Ambulatory Visit (INDEPENDENT_AMBULATORY_CARE_PROVIDER_SITE_OTHER): Payer: BLUE CROSS/BLUE SHIELD | Admitting: Family

## 2017-03-09 ENCOUNTER — Encounter: Payer: Self-pay | Admitting: Family

## 2017-03-09 VITALS — BP 105/66 | HR 77 | Temp 97.5°F | Ht 72.0 in | Wt 164.8 lb

## 2017-03-09 DIAGNOSIS — E1065 Type 1 diabetes mellitus with hyperglycemia: Secondary | ICD-10-CM | POA: Diagnosis not present

## 2017-03-09 DIAGNOSIS — E559 Vitamin D deficiency, unspecified: Secondary | ICD-10-CM | POA: Diagnosis not present

## 2017-03-09 DIAGNOSIS — E782 Mixed hyperlipidemia: Secondary | ICD-10-CM

## 2017-03-09 LAB — CMP14+EGFR
A/G RATIO: 1.7 (ref 1.2–2.2)
ALBUMIN: 4.1 g/dL (ref 3.5–5.5)
ALK PHOS: 86 IU/L (ref 39–117)
ALT: 35 IU/L (ref 0–44)
AST: 41 IU/L — ABNORMAL HIGH (ref 0–40)
BILIRUBIN TOTAL: 0.4 mg/dL (ref 0.0–1.2)
BUN / CREAT RATIO: 20 (ref 9–20)
BUN: 24 mg/dL (ref 6–24)
CHLORIDE: 103 mmol/L (ref 96–106)
CO2: 23 mmol/L (ref 20–29)
Calcium: 9 mg/dL (ref 8.7–10.2)
Creatinine, Ser: 1.23 mg/dL (ref 0.76–1.27)
GFR calc non Af Amer: 70 mL/min/{1.73_m2} (ref >59)
GFR, EST AFRICAN AMERICAN: 81 mL/min/{1.73_m2} (ref >59)
GLOBULIN, TOTAL: 2.4 g/dL (ref 1.5–4.5)
Glucose: 189 mg/dL — ABNORMAL HIGH (ref 65–99)
Potassium: 5.3 mmol/L — ABNORMAL HIGH (ref 3.5–5.2)
SODIUM: 135 mmol/L (ref 134–144)
TOTAL PROTEIN: 6.5 g/dL (ref 6.0–8.5)

## 2017-03-09 LAB — BAYER DCA HB A1C WAIVED: HB A1C (BAYER DCA - WAIVED): 8.3 % — ABNORMAL HIGH (ref <7.0)

## 2017-03-09 MED ORDER — VITAMIN D (ERGOCALCIFEROL) 1.25 MG (50000 UNIT) PO CAPS: 50000.0000 [IU] | ORAL_CAPSULE | ORAL | 3 refills | Status: DC

## 2017-03-09 MED ORDER — ROSUVASTATIN CALCIUM 20 MG PO TABS: 20.0000 mg | ORAL_TABLET | Freq: Every day | ORAL | 3 refills | Status: DC
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
## 2017-03-09 NOTE — Patient Instructions (Signed)

## 2017-03-10 ENCOUNTER — Other Ambulatory Visit: Payer: Self-pay | Admitting: Family

## 2017-03-10 MED ORDER — INSULIN DEGLUDEC 100 UNIT/ML ~~LOC~~ SOPN: 8.0000 [IU] | PEN_INJECTOR | Freq: Every day | SUBCUTANEOUS | 6 refills | Status: DC

## 2017-04-09 ENCOUNTER — Ambulatory Visit (INDEPENDENT_AMBULATORY_CARE_PROVIDER_SITE_OTHER): Payer: BLUE CROSS/BLUE SHIELD | Admitting: Family Medicine

## 2017-04-09 ENCOUNTER — Encounter: Payer: Self-pay | Admitting: Family Medicine

## 2017-04-09 VITALS — BP 103/59 | HR 98 | Temp 97.4°F | Ht 73.0 in | Wt 161.2 lb

## 2017-04-09 DIAGNOSIS — J029 Acute pharyngitis, unspecified: Secondary | ICD-10-CM

## 2017-04-09 LAB — RAPID STREP SCREEN (MED CTR MEBANE ONLY): Strep Gp A Ag, IA W/Reflex: NEGATIVE

## 2017-04-09 LAB — CULTURE, GROUP A STREP

## 2017-04-09 MED ORDER — AMOXICILLIN 500 MG PO CAPS: 500.0000 mg | ORAL_CAPSULE | Freq: Two times a day (BID) | ORAL | 0 refills | Status: DC

## 2017-04-09 NOTE — Progress Notes (Signed)
   HPI  Patient presents today here with sore throat.  Patient states that his wife and his six-year-old child have been positive for strep pharyngitis this week.  He reports 2 days of sore throat, and elevated temperature up to 100.1 yesterday.  He is tolerating food and fluids like usual. He denies cough, shortness of breath, chest pain,  PMH: Smoking status noted ROS: Per HPI  Objective: BP (!) 103/59   Pulse 98   Temp (!) 97.4 F (36.3 C) (Oral)   Ht 6\' 1"  (1.854 m)   Wt 161 lb 3.2 oz (73.1 kg)   BMI 21.27 kg/m  Gen: NAD, alert, cooperative with exam HEENT: NCAT, oropharynx with mild erythema, TMs normal bilaterally, oropharynx moist, nares clear CV: RRR, good S1/S2, no murmur Resp: CTABL, no wheezes, non-labored Ext: No edema, warm Neuro: Alert and oriented, No gross deficits  Assessment and plan:  # Sore throat Positive contacts I would recommend treating. Rapid strep is negative, culture is pending. amox is sent     Orders Placed This Encounter  Procedures  . Culture, Group A Strep    Order Specific Question:   Source    Answer:   throat  . [facility] screen ([facility])    Meds ordered this encounter  Medications  . amoxicillin (AMOXIL) 500 MG capsule    Sig: Take 1 capsule (500 mg total) by mouth 2 (two) times daily.    Dispense:  20 capsule    Refill:  0    Murtis SinkSam Bradshaw, MD [facility] 04/09/2017, 9:36 AM

## 2017-04-09 NOTE — Patient Instructions (Signed)
Nice to meet you!  Call or come back with any questions.    Strep Throat Strep throat is an infection of the throat. It is caused by germs. Strep throat spreads from person to person because of coughing, sneezing, or close contact. Follow these instructions at home: Medicines  Take over-the-counter and prescription medicines only as told by your doctor.  Take your antibiotic medicine as told by your doctor. Do not stop taking the medicine even if you feel better.  Have family members who also have a sore throat or fever go to a doctor. Eating and drinking  Do not share food, drinking cups, or personal items.  Try eating soft foods until your sore throat feels better.  Drink enough fluid to keep your pee (urine) clear or pale yellow. General instructions  Rinse your mouth (gargle) with a salt-water mixture 3-4 times per day or as needed. To make a salt-water mixture, stir -1 tsp of salt into 1 cup of warm water.  Make sure that all people in your house wash their hands well.  Rest.  Stay home from school or work until you have been taking antibiotics for 24 hours.  Keep all follow-up visits as told by your doctor. This is important. Contact a doctor if:  Your neck keeps getting bigger.  You get a rash, cough, or earache.  You cough up thick liquid that is green, yellow-brown, or bloody.  You have pain that does not get better with medicine.  Your problems get worse instead of getting better.  You have a fever. Get help right away if:  You throw up (vomit).  You get a very bad headache.  You neck hurts or it feels stiff.  You have chest pain or you are short of breath.  You have drooling, very bad throat pain, or changes in your voice.  Your neck is swollen or the skin gets red and tender.  Your mouth is dry or you are peeing less than normal.  You keep feeling more tired or it is hard to wake up.  Your joints are red or they hurt. This information is  not intended to replace advice given to you by your health care provider. Make sure you discuss any questions you have with your health care provider. Document Released: 12/31/2007 Document Revised: 03/12/2016 Document Reviewed: 11/06/2014 Elsevier Interactive Patient Education  [employer]2018 Elsevier Inc.

## 2017-04-12 LAB — CULTURE, GROUP A STREP

## 2017-06-11 ENCOUNTER — Encounter: Payer: Self-pay | Admitting: Family

## 2017-06-11 ENCOUNTER — Ambulatory Visit: Payer: BLUE CROSS/BLUE SHIELD | Admitting: Family

## 2017-06-11 ENCOUNTER — Telehealth: Payer: Self-pay | Admitting: *Deleted

## 2017-06-11 VITALS — BP 96/63 | HR 91 | Temp 97.3°F | Ht 73.0 in | Wt 168.2 lb

## 2017-06-11 DIAGNOSIS — E782 Mixed hyperlipidemia: Secondary | ICD-10-CM

## 2017-06-11 DIAGNOSIS — E1069 Type 1 diabetes mellitus with other specified complication: Secondary | ICD-10-CM

## 2017-06-11 DIAGNOSIS — E1065 Type 1 diabetes mellitus with hyperglycemia: Secondary | ICD-10-CM

## 2017-06-11 DIAGNOSIS — E559 Vitamin D deficiency, unspecified: Secondary | ICD-10-CM | POA: Diagnosis not present

## 2017-06-11 LAB — BAYER DCA HB A1C WAIVED: HB A1C (BAYER DCA - WAIVED): 7.9 % — ABNORMAL HIGH (ref <7.0)

## 2017-06-11 MED ORDER — INSULIN ASPART 100 UNIT/ML FLEXPEN: PEN_INJECTOR | SUBCUTANEOUS | 11 refills | Status: DC

## 2017-06-11 MED ORDER — INSULIN NPH ISOPHANE & REGULAR (70-30) 100 UNIT/ML ~~LOC~~ SUSP: 15.0000 [IU] | Freq: Two times a day (BID) | SUBCUTANEOUS | 6 refills | Status: DC

## 2017-06-11 MED ORDER — FREESTYLE LIBRE READER DEVI: 1.0000 | Freq: Every day | 0 refills | Status: DC

## 2017-06-11 MED ORDER — FREESTYLE LIBRE SENSOR SYSTEM MISC: 1.0000 | Freq: Every day | 0 refills | Status: DC

## 2017-06-11 MED ORDER — INSULIN DEGLUDEC 100 UNIT/ML ~~LOC~~ SOPN: 10.0000 [IU] | PEN_INJECTOR | Freq: Every day | SUBCUTANEOUS | 6 refills | Status: DC

## 2017-06-11 NOTE — Telephone Encounter (Signed)
Forward to PCP/Christy

## 2017-06-11 NOTE — Patient Instructions (Signed)
Type 1 Diabetes Mellitus, Diagnosis, Adult Type 1 diabetes (type 1 diabetes mellitus) is a long-term (chronic) disease. It happens when your body does not make enough of a hormone called insulin. Insulin lets sugars (glucose) go into cells in the body. This gives you energy. If your body does not make enough insulin, sugars cannot get into cells. This causes high blood sugar (hyperglycemia). Your doctor will set treatment goals for you. Generally, you should have these blood sugar levels:  Before meals (preprandial): 80-130 mg/dL (4.4-7.2 mmol/L).  After meals (postprandial): below 180 mg/dL (10 mmol/L).  A1c (hemoglobin A1c) level: less than 7%.  Follow these instructions at home: Questions to Ask Your Doctor  You may want to ask these questions:  Do I need to meet with a diabetes educator?  Where can I find a support group for people with diabetes?  What equipment will I need to care for myself at home?  What diabetes medicines do I need? When should I take them?  How often do I need to check my blood sugar?  What number can I call if I have questions?  When is my next doctor's visit?  General instructions  Take over-the-counter and prescription medicines only as told by your doctor.  Keep all follow-up visits as told by your doctor. This is important. Contact a doctor if:  Your blood sugar is at or above 240 mg/dL (13.3 mmol/L) for 2 days in a row.  You have been sick or have had a fever for 2 days or more, and you are not getting better.  You have any of these problems for more than 6 hours: ? You cannot eat or drink. ? You feel sick to your stomach (nauseous). ? You throw up (vomit). ? You have watery poop (diarrhea). Get help right away if:  Your blood sugar is lower than 54 mg/dL (3 mmol/L).  You get confused.  You have trouble: ? Thinking clearly. ? Breathing.  You have moderate or large ketone levels in your pee (urine). This information is not  intended to replace advice given to you by your health care provider. Make sure you discuss any questions you have with your health care provider. Document Released: 11/05/2015 Document Revised: 12/20/2015 Document Reviewed: 08/17/2015 Elsevier Interactive Patient Education  2018 Elsevier Inc.  

## 2017-06-11 NOTE — Telephone Encounter (Signed)
Pharmacy called

## 2017-06-11 NOTE — Telephone Encounter (Signed)
They need to know maximum number of Novolog units that pt can take a day for ins reasons. [facility] [phone]

## 2017-06-11 NOTE — Progress Notes (Signed)
Subjective:    Patient ID: [name], male    DOB: 09/01/1970, 46 y.o.   MRN: [id number]  Diabetes  He presents for his follow-up diabetic visit. He has type 2 diabetes mellitus. His disease course has been stable. There are no hypoglycemic associated symptoms. Pertinent negatives for diabetes include no blurred vision, no foot paresthesias and no visual change. There are no hypoglycemic complications. Symptoms are stable. There are no diabetic complications. Pertinent negatives for diabetic complications include no CVA, heart disease, nephropathy or peripheral neuropathy. Risk factors for coronary artery disease include male sex, stress, diabetes mellitus and dyslipidemia. He is following a diabetic diet.      Review of Systems  Eyes: Negative for blurred vision.  All other systems reviewed and are negative.      Objective:   Physical Exam  Constitutional: He is oriented to person, place, and time. He appears well-developed and well-nourished. No distress.  HENT:  Head: Normocephalic.  Right Ear: External ear normal.  Left Ear: External ear normal.  Nose: Nose normal.  Mouth/Throat: Oropharynx is clear and moist.  Eyes: Pupils are equal, round, and reactive to light. Right eye exhibits no discharge. Left eye exhibits no discharge.  Neck: Normal range of motion. Neck supple. No thyromegaly present.  Cardiovascular: Normal rate, regular rhythm, normal heart sounds and intact distal pulses.  No murmur heard. Pulmonary/Chest: Effort normal and breath sounds normal. No respiratory distress. He has no wheezes.  Abdominal: Soft. Bowel sounds are normal. He exhibits no distension. There is no tenderness.  Musculoskeletal: Normal range of motion. He exhibits no edema or tenderness.  Neurological: He is alert and oriented to person, place, and time.  Skin: Skin is warm and dry. No rash noted. No erythema.  Psychiatric: He has a normal mood and affect. His behavior is normal. Judgment  and thought content normal.  Vitals reviewed.     BP 96/63   Pulse 91   Temp (!) 97.3 F (36.3 C) (Oral)   Ht 6' 1" (1.854 m)   Wt 168 lb 3.2 oz (76.3 kg)   BMI 22.19 kg/m      Assessment & Plan:  1. Type 1 diabetes mellitus with hyperglycemia (HCC) A1C 7.9, improved but not at goal Will increase Tresiba to 10 units and reorder sliding scale RTO in 3 months  - CMP14+EGFR - Bayer DCA Hb A1c Waived - Microalbumin / creatinine urine ratio - insulin aspart (NOVOLOG) 100 UNIT/ML FlexPen; Inject as needed for elevated BG. BG over 200 - give 2 units; 251 to 300 - 3 units; 301 or above 4 units  Dispense: 15 mL; Refill: 11 - Continuous Blood Gluc Sensor (FREESTYLE LIBRE SENSOR SYSTEM) MISC; 1 Device 6 (six) times daily by Does not apply route.  Dispense: 1 each; Refill: 0 - Continuous Blood Gluc Receiver (FREESTYLE LIBRE READER) DEVI; 1 Device 6 (six) times daily by Does not apply route.  Dispense: 1 Device; Refill: 0 - insulin NPH-regular Human (NOVOLIN 70/30) (70-30) 100 UNIT/ML injection; Inject 15 Units 2 (two) times daily with a meal into the skin.  Dispense: 10 mL; Refill: 6 - insulin degludec (TRESIBA FLEXTOUCH) 100 UNIT/ML SOPN FlexTouch Pen; Inject 0.1 mLs (10 Units total) daily at 10 pm into the skin.  Dispense: 3 pen; Refill: 6  2. 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 Vitamin D deficiency    Evelina Dun, FNP

## 2017-06-12 LAB — CMP14+EGFR
A/G RATIO: 2 (ref 1.2–2.2)
ALT: 21 IU/L (ref 0–44)
AST: 32 IU/L (ref 0–40)
Albumin: 4.5 g/dL (ref 3.5–5.5)
Alkaline Phosphatase: 77 IU/L (ref 39–117)
BILIRUBIN TOTAL: 0.6 mg/dL (ref 0.0–1.2)
BUN/Creatinine Ratio: 17 (ref 9–20)
BUN: 21 mg/dL (ref 6–24)
CALCIUM: 9.2 mg/dL (ref 8.7–10.2)
CHLORIDE: 97 mmol/L (ref 96–106)
CO2: 27 mmol/L (ref 20–29)
Creatinine, Ser: 1.25 mg/dL (ref 0.76–1.27)
GFR, EST AFRICAN AMERICAN: 80 mL/min/{1.73_m2} (ref >59)
GFR, EST NON AFRICAN AMERICAN: 69 mL/min/{1.73_m2} (ref >59)
Globulin, Total: 2.3 g/dL (ref 1.5–4.5)
Glucose: 98 mg/dL (ref 65–99)
POTASSIUM: 5.4 mmol/L — AB (ref 3.5–5.2)
SODIUM: 139 mmol/L (ref 134–144)
TOTAL PROTEIN: 6.8 g/dL (ref 6.0–8.5)

## 2017-06-12 LAB — LIPID PANEL
CHOL/HDL RATIO: 2.6 ratio (ref 0.0–5.0)
Cholesterol, Total: 105 mg/dL (ref 100–199)
HDL: 40 mg/dL (ref >39)
LDL Calculated: 40 mg/dL (ref 0–99)
TRIGLYCERIDES: 123 mg/dL (ref 0–149)
VLDL Cholesterol Cal: 25 mg/dL (ref 5–40)

## 2017-06-12 LAB — MICROALBUMIN / CREATININE URINE RATIO
Creatinine, Urine: 166.2 mg/dL
MICROALB/CREAT RATIO: 2.8 mg/g{creat} (ref 0.0–30.0)
Microalbumin, Urine: 4.6 ug/mL

## 2017-06-15 ENCOUNTER — Other Ambulatory Visit: Payer: Self-pay | Admitting: Family

## 2017-06-15 ENCOUNTER — Encounter: Payer: Self-pay | Admitting: Family

## 2017-06-15 DIAGNOSIS — E875 Hyperkalemia: Secondary | ICD-10-CM
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
## 2017-09-08 LAB — HM DIABETES EYE EXAM

## 2017-09-15 ENCOUNTER — Ambulatory Visit: Payer: BLUE CROSS/BLUE SHIELD | Admitting: Family

## 2017-09-16 ENCOUNTER — Encounter: Payer: Self-pay | Admitting: Family

## 2017-11-04 ENCOUNTER — Ambulatory Visit: Payer: BLUE CROSS/BLUE SHIELD | Admitting: Family

## 2017-11-04 ENCOUNTER — Encounter: Payer: Self-pay | Admitting: Family

## 2017-11-04 VITALS — BP 110/67 | HR 94 | Temp 97.0°F | Ht 73.0 in | Wt 166.4 lb

## 2017-11-04 DIAGNOSIS — E782 Mixed hyperlipidemia: Secondary | ICD-10-CM

## 2017-11-04 DIAGNOSIS — E1069 Type 1 diabetes mellitus with other specified complication: Secondary | ICD-10-CM | POA: Diagnosis not present

## 2017-11-04 DIAGNOSIS — E1065 Type 1 diabetes mellitus with hyperglycemia: Secondary | ICD-10-CM | POA: Diagnosis not present

## 2017-11-04 DIAGNOSIS — E559 Vitamin D deficiency, unspecified: Secondary | ICD-10-CM | POA: Diagnosis not present

## 2017-11-04 LAB — CMP14+EGFR
ALBUMIN: 4.3 g/dL (ref 3.5–5.5)
ALT: 21 IU/L (ref 0–44)
AST: 26 IU/L (ref 0–40)
Albumin/Globulin Ratio: 1.7 (ref 1.2–2.2)
Alkaline Phosphatase: 84 IU/L (ref 39–117)
BUN / CREAT RATIO: 16 (ref 9–20)
BUN: 20 mg/dL (ref 6–24)
Bilirubin Total: 0.5 mg/dL (ref 0.0–1.2)
CALCIUM: 9.2 mg/dL (ref 8.7–10.2)
CO2: 25 mmol/L (ref 20–29)
CREATININE: 1.29 mg/dL — AB (ref 0.76–1.27)
Chloride: 98 mmol/L (ref 96–106)
GFR calc Af Amer: 77 mL/min/{1.73_m2} (ref >59)
GFR, EST NON AFRICAN AMERICAN: 67 mL/min/{1.73_m2} (ref >59)
GLOBULIN, TOTAL: 2.5 g/dL (ref 1.5–4.5)
GLUCOSE: 238 mg/dL — AB (ref 65–99)
Potassium: 4.7 mmol/L (ref 3.5–5.2)
SODIUM: 137 mmol/L (ref 134–144)
TOTAL PROTEIN: 6.8 g/dL (ref 6.0–8.5)

## 2017-11-04 LAB — BAYER DCA HB A1C WAIVED: HB A1C (BAYER DCA - WAIVED): 8.2 % — ABNORMAL HIGH (ref <7.0)

## 2017-11-04 LAB — LIPID PANEL
Chol/HDL Ratio: 3 ratio (ref 0.0–5.0)
Cholesterol, Total: 105 mg/dL (ref 100–199)
HDL: 35 mg/dL — ABNORMAL LOW (ref >39)
LDL CALC: 31 mg/dL (ref 0–99)
Triglycerides: 197 mg/dL — ABNORMAL HIGH (ref 0–149)
VLDL Cholesterol Cal: 39 mg/dL (ref 5–40)

## 2017-11-04 MED ORDER — FREESTYLE LIBRE READER DEVI: 1.0000 | Freq: Every day | 0 refills | Status: DC

## 2017-11-04 MED ORDER — FREESTYLE LIBRE SENSOR SYSTEM MISC: 1.0000 | Freq: Every day | 0 refills | Status: DC

## 2017-11-04 NOTE — Patient Instructions (Signed)

## 2017-11-04 NOTE — Progress Notes (Signed)
   Subjective:    Patient ID: [name], male    DOB: 02-27-1971, 47 y.o.   MRN: [id number]  Pt presents to the office today for chronic follow up.  Diabetes  He presents for his follow-up diabetic visit. He has type 2 diabetes mellitus. His disease course has been stable. There are no hypoglycemic associated symptoms. There are no diabetic associated symptoms. Pertinent negatives for diabetes include no blurred vision, no foot paresthesias and no visual change. There are no hypoglycemic complications. Symptoms are stable. There are no diabetic complications. Risk factors for coronary artery disease include dyslipidemia, obesity and sedentary lifestyle. He is following a generally healthy diet. His overall blood glucose range is 140-180 mg/dl. Eye exam is current.  Hyperlipidemia  This is a chronic problem. The current episode started more than 1 year ago. The problem is controlled. Recent lipid tests were reviewed and are normal. Current antihyperlipidemic treatment includes statins. The current treatment provides moderate improvement of lipids. Risk factors for coronary artery disease include dyslipidemia, diabetes mellitus, male sex, hypertension and a sedentary lifestyle.      Review of Systems  Eyes: Negative for blurred vision.  All other systems reviewed and are negative.      Objective:   Physical Exam  Constitutional: He is oriented to person, place, and time. He appears well-developed and well-nourished. No distress.  HENT:  Head: Normocephalic.  Right Ear: External ear normal.  Left Ear: External ear normal.  Nose: Nose normal.  Mouth/Throat: Oropharynx is clear and moist.  Eyes: Pupils are equal, round, and reactive to light. Right eye exhibits no discharge. Left eye exhibits no discharge.  Neck: Normal range of motion. Neck supple. No thyromegaly present.  Cardiovascular: Normal rate, regular rhythm, normal heart sounds and intact distal pulses.  No murmur  heard. Pulmonary/Chest: Effort normal and breath sounds normal. No respiratory distress. He has no wheezes.  Abdominal: Soft. Bowel sounds are normal. He exhibits no distension. There is no tenderness.  Musculoskeletal: Normal range of motion. He exhibits no edema or tenderness.  Neurological: He is alert and oriented to person, place, and time.  Skin: Skin is warm and dry. No rash noted. No erythema.  Psychiatric: He has a normal mood and affect. His behavior is normal. Judgment and thought content normal.  Vitals reviewed.     BP 110/67   Pulse 94   Temp (!) 97 F (36.1 C) (Oral)   Ht '6\' 1"'$  (1.854 m)   Wt 166 lb 6.4 oz (75.5 kg)   BMI 21.95 kg/m      Assessment & Plan:  1. Type 1 diabetes mellitus with hyperglycemia (HCC) - Bayer DCA Hb A1c Waived - CMP14+EGFR - Continuous Blood Gluc Sensor (FREESTYLE LIBRE SENSOR SYSTEM) MISC; 1 Device by Does not apply route 6 (six) times daily.  Dispense: 1 each; Refill: 0 - Continuous Blood Gluc Receiver (FREESTYLE LIBRE READER) DEVI; 1 Device by Does not apply route 6 (six) times daily.  Dispense: 1 Device; Refill: 0  2. 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 Vitamin D deficiency - CMP14+EGFR   Continue all meds Labs pending Health Maintenance reviewed Diet and exercise encouraged RTO 4 months   Evelina Dun, FNP

## 2017-11-05 ENCOUNTER — Telehealth: Payer: Self-pay | Admitting: Family

## 2017-11-05 ENCOUNTER — Other Ambulatory Visit: Payer: Self-pay | Admitting: Family

## 2017-11-05 DIAGNOSIS — E1065 Type 1 diabetes mellitus with hyperglycemia: Secondary | ICD-10-CM

## 2017-11-05 MED ORDER — INSULIN DEGLUDEC 100 UNIT/ML ~~LOC~~ SOPN: 15.0000 [IU] | PEN_INJECTOR | Freq: Every day | SUBCUTANEOUS | 6 refills | Status: DC

## 2017-11-09 NOTE — Telephone Encounter (Signed)
Refer to lab note °

## 2017-11-28 IMAGING — DX DG CHEST 2V
2 series · 2 of 2 positions shown · non-contrast
Comparison: 08/05/2007.

CLINICAL DATA: Cough and fever .

EXAM:
CHEST  2 VIEW

[chest pa]
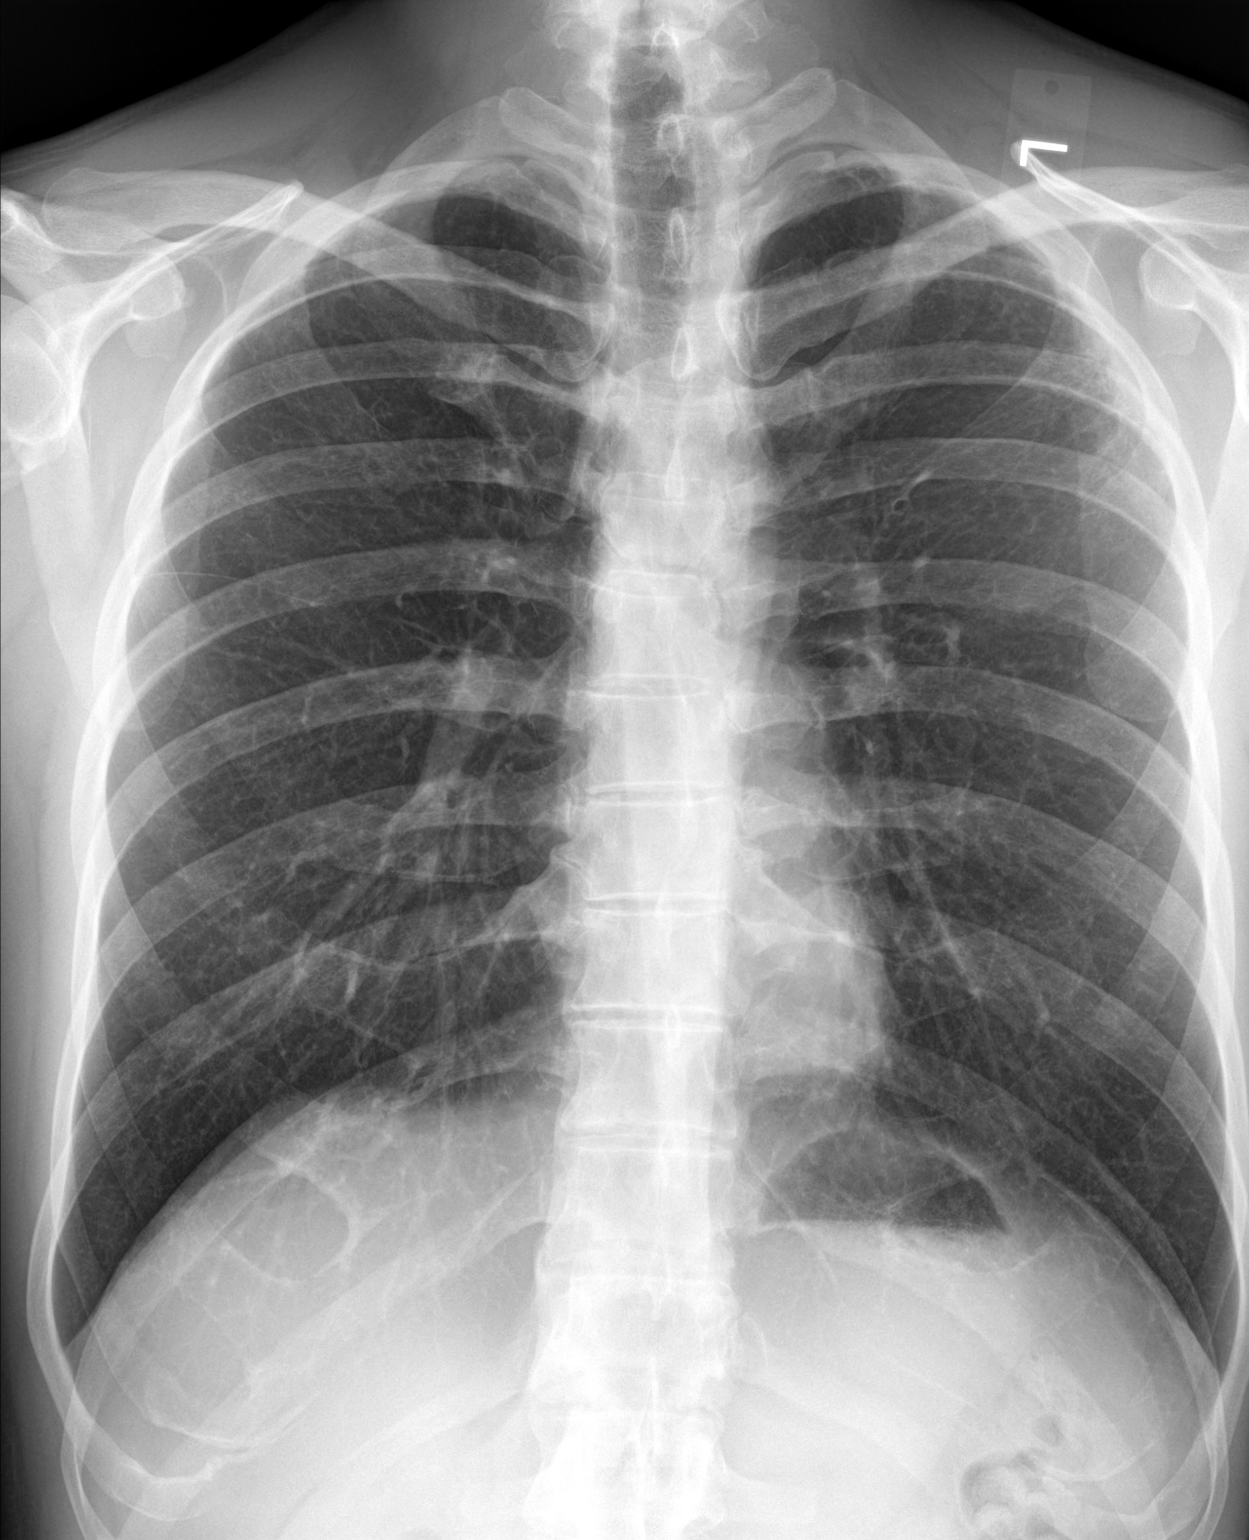

[chest lat]
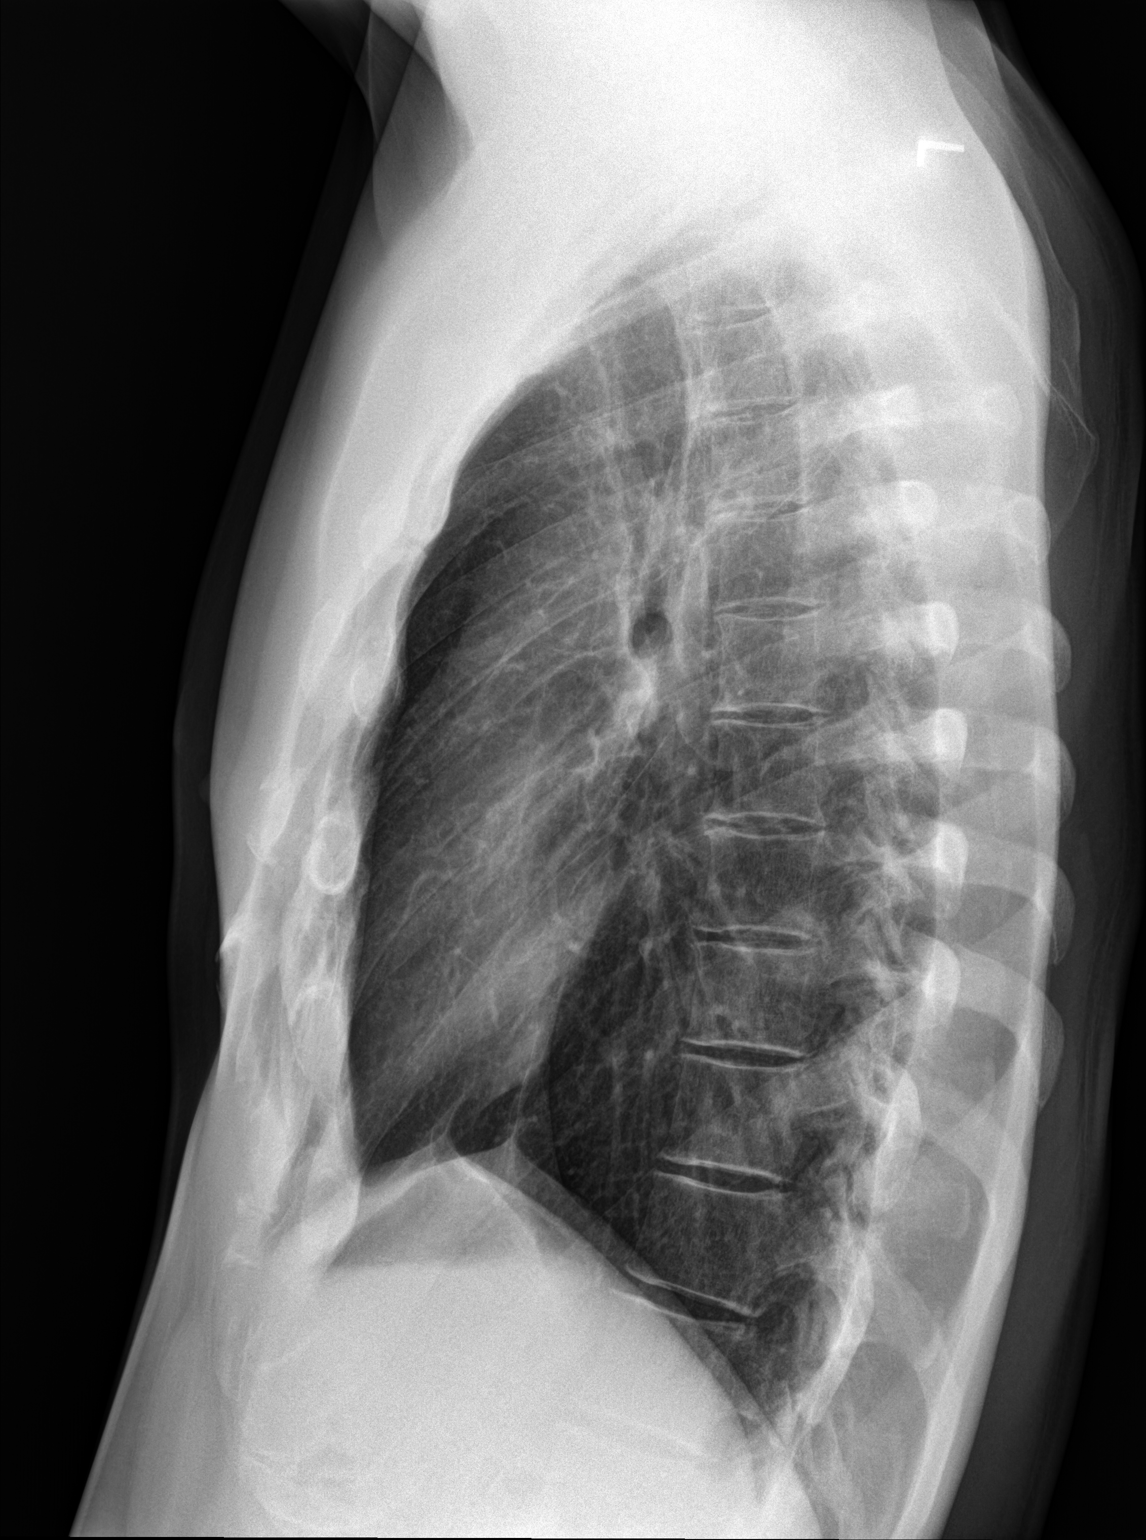

[2 of 2 positions shown; findings below may reference images not displayed]

FINDINGS: Mediastinum hilar structures normal. Left apical pleural-parenchymal
thickening is noted. Changes are most likely related to scarring.
Mild active infiltrate in this region cannot be excluded no pleural
effusion or pneumothorax. Heart size normal. No acute bony
abnormality.
IMPRESSION: Left apical pleural-parenchymal thickening noted most consistent
with scarring. Mild active infiltrate this region cannot be excluded
.

## 2018-01-05 ENCOUNTER — Telehealth: Payer: Self-pay | Admitting: Family

## 2018-01-05 ENCOUNTER — Other Ambulatory Visit: Payer: Self-pay | Admitting: *Deleted

## 2018-01-05 DIAGNOSIS — E10641 Type 1 diabetes mellitus with hypoglycemia with coma: Secondary | ICD-10-CM

## 2018-01-05 DIAGNOSIS — E10649 Type 1 diabetes mellitus with hypoglycemia without coma: Secondary | ICD-10-CM

## 2018-01-05 DIAGNOSIS — E1065 Type 1 diabetes mellitus with hyperglycemia: Secondary | ICD-10-CM

## 2018-01-05 MED ORDER — GLUCOSE BLOOD VI STRP: ORAL_STRIP | 2 refills | Status: DC

## 2018-01-05 MED ORDER — FREESTYLE LANCETS MISC: 12 refills | Status: DC

## 2018-01-05 MED ORDER — FREESTYLE LIBRE SENSOR SYSTEM MISC: 1.0000 | Freq: Every day | 11 refills | Status: DC

## 2018-01-05 NOTE — Telephone Encounter (Signed)
Test strips and lancets sent to pharmacy 

## 2018-01-11 ENCOUNTER — Telehealth: Payer: Self-pay | Admitting: Family

## 2018-01-11 DIAGNOSIS — E1065 Type 1 diabetes mellitus with hyperglycemia: Secondary | ICD-10-CM

## 2018-01-11 MED ORDER — FREESTYLE LIBRE SENSOR SYSTEM MISC: 3 refills | Status: DC

## 2018-01-11 NOTE — Telephone Encounter (Signed)
Pt aware - I have re-submitted the RX

## 2018-03-08 ENCOUNTER — Ambulatory Visit: Payer: BLUE CROSS/BLUE SHIELD | Admitting: Family

## 2018-03-08 ENCOUNTER — Encounter: Payer: Self-pay | Admitting: Family

## 2018-03-08 VITALS — BP 105/72 | HR 83 | Temp 97.4°F | Ht 73.0 in | Wt 167.0 lb

## 2018-03-08 DIAGNOSIS — E559 Vitamin D deficiency, unspecified: Secondary | ICD-10-CM

## 2018-03-08 DIAGNOSIS — E1069 Type 1 diabetes mellitus with other specified complication: Secondary | ICD-10-CM

## 2018-03-08 DIAGNOSIS — Z0000 Encounter for general adult medical examination without abnormal findings: Secondary | ICD-10-CM | POA: Diagnosis not present

## 2018-03-08 DIAGNOSIS — E1065 Type 1 diabetes mellitus with hyperglycemia: Secondary | ICD-10-CM

## 2018-03-08 DIAGNOSIS — E782 Mixed hyperlipidemia: Secondary | ICD-10-CM

## 2018-03-08 NOTE — Progress Notes (Addendum)
 Subjective:    Patient ID: [name], male    DOB: 01/08/1971, 47 y.o.   MRN: [id number]  Chief Complaint  Patient presents with  . Diabetes    four month recheck   PT presents to the office today for CPE.  Diabetes  He presents for his follow-up diabetic visit. He has type 2 diabetes mellitus. His disease course has been stable. There are no hypoglycemic associated symptoms. Pertinent negatives for diabetes include no blurred vision and no foot paresthesias. Pertinent negatives for hypoglycemia complications include no blackouts, no hospitalization and no nocturnal hypoglycemia. Symptoms are stable. Pertinent negatives for diabetic complications include no heart disease, nephropathy or peripheral neuropathy. Risk factors for coronary artery disease include dyslipidemia, diabetes mellitus, hypertension, male sex and obesity. He is following a generally healthy diet. His overall blood glucose range is 180-200 mg/dl. Eye exam is current.  Hyperlipidemia  This is a chronic problem. The current episode started more than 1 year ago. The problem is controlled. Recent lipid tests were reviewed and are normal. Current antihyperlipidemic treatment includes statins. The current treatment provides moderate improvement of lipids. Risk factors for coronary artery disease include dyslipidemia, male sex and a sedentary lifestyle.      Review of Systems  Eyes: Negative for blurred vision.  All other systems reviewed and are negative.  Family History  Problem Relation Age of Onset  . Muscular dystrophy Mother   . Diabetes Father    Social History   Socioeconomic History  . Marital status: Married    Spouse name: Not on file  . Number of children: Not on file  . Years of education: Not on file  . Highest education level: Not on file  Occupational History  . Not on file  Social Needs  . Financial resource strain: Not on file  . Food insecurity:    Worry: Not on file    Inability: Not on  file  . Transportation needs:    Medical: Not on file    Non-medical: Not on file  Tobacco Use  . Smoking status: Never Smoker  . Smokeless tobacco: Never Used  Substance and Sexual Activity  . Alcohol use: No  . Drug use: No  . Sexual activity: Yes  Lifestyle  . Physical activity:    Days per week: Not on file    Minutes per session: Not on file  . Stress: Not on file  Relationships  . Social connections:    Talks on phone: Not on file    Gets together: Not on file    Attends religious service: Not on file    Active member of club or organization: Not on file    Attends meetings of clubs or organizations: Not on file    Relationship status: Not on file  Other Topics Concern  . Not on file  Social History Narrative  . Not on file       Objective:   Physical Exam  Constitutional: He is oriented to person, place, and time. He appears well-developed and well-nourished. No distress.  HENT:  Head: Normocephalic.  Right Ear: External ear normal.  Left Ear: External ear normal.  Mouth/Throat: Oropharynx is clear and moist.  Eyes: Pupils are equal, round, and reactive to light. Right eye exhibits no discharge. Left eye exhibits no discharge.  Neck: Normal range of motion. Neck supple. No thyromegaly present.  Cardiovascular: Normal rate, regular rhythm, normal heart sounds and intact distal pulses.  No murmur heard. Pulmonary/Chest: Effort   normal and breath sounds normal. No respiratory distress. He has no wheezes.  Abdominal: Soft. Bowel sounds are normal. He exhibits no distension. There is no tenderness.  Musculoskeletal: Normal range of motion. He exhibits no edema or tenderness.  Neurological: He is alert and oriented to person, place, and time. He has normal reflexes. No cranial nerve deficit.  Skin: Skin is warm and dry. No rash noted. No erythema.  Psychiatric: He has a normal mood and affect. His behavior is normal. Judgment and thought content normal.  Vitals  reviewed.     BP 105/72   Pulse 83   Temp (!) 97.4 F (36.3 C) (Oral)   Ht 6' 1" (1.854 m)   Wt 167 lb (75.8 kg)   BMI 22.03 kg/m      Assessment & Plan:  [name] comes in today with chief complaint of Diabetes (four month recheck)   Diagnosis and orders addressed:  1. Annual physical exam - Bayer DCA Hb A1c Waived - CMP14+EGFR - Lipid panel - CBC with Differential/Platelet - TSH - VITAMIN D 25 Hydroxy (Vit-D Deficiency, Fractures) - PSA, total and free  2. Type 1 diabetes mellitus with hyperglycemia (HCC) - Bayer DCA Hb A1c Waived - CMP14+EGFR - CBC with Differential/Platelet  3. 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 Vitamin D deficiency - CMP14+EGFR - CBC with Differential/Platelet - VITAMIN D 25 Hydroxy (Vit-D Deficiency, Fractures)   Labs pending Health Maintenance reviewed Diet and exercise encouraged  Follow up plan: 3 months    Evelina Dun, FNP

## 2018-03-08 NOTE — Patient Instructions (Signed)

## 2018-03-09 ENCOUNTER — Other Ambulatory Visit: Payer: Self-pay | Admitting: Family

## 2018-03-09 DIAGNOSIS — E039 Hypothyroidism, unspecified: Secondary | ICD-10-CM | POA: Insufficient documentation

## 2018-03-09 LAB — CMP14+EGFR
ALT: 21 IU/L (ref 0–44)
AST: 31 IU/L (ref 0–40)
Albumin/Globulin Ratio: 1.8 (ref 1.2–2.2)
Albumin: 4.3 g/dL (ref 3.5–5.5)
Alkaline Phosphatase: 72 IU/L (ref 39–117)
BILIRUBIN TOTAL: 0.6 mg/dL (ref 0.0–1.2)
BUN/Creatinine Ratio: 11 (ref 9–20)
BUN: 14 mg/dL (ref 6–24)
CHLORIDE: 99 mmol/L (ref 96–106)
CO2: 25 mmol/L (ref 20–29)
CREATININE: 1.22 mg/dL (ref 0.76–1.27)
Calcium: 9.1 mg/dL (ref 8.7–10.2)
GFR calc non Af Amer: 71 mL/min/{1.73_m2} (ref >59)
GFR, EST AFRICAN AMERICAN: 82 mL/min/{1.73_m2} (ref >59)
GLUCOSE: 69 mg/dL (ref 65–99)
Globulin, Total: 2.4 g/dL (ref 1.5–4.5)
Potassium: 4.2 mmol/L (ref 3.5–5.2)
Sodium: 138 mmol/L (ref 134–144)
TOTAL PROTEIN: 6.7 g/dL (ref 6.0–8.5)

## 2018-03-09 LAB — CBC WITH DIFFERENTIAL/PLATELET
BASOS: 0 %
Basophils Absolute: 0 10*3/uL (ref 0.0–0.2)
EOS (ABSOLUTE): 0.1 10*3/uL (ref 0.0–0.4)
EOS: 3 %
Hematocrit: 41.1 % (ref 37.5–51.0)
Hemoglobin: 13.9 g/dL (ref 13.0–17.7)
IMMATURE GRANULOCYTES: 0 %
Immature Grans (Abs): 0 10*3/uL (ref 0.0–0.1)
LYMPHS ABS: 2.1 10*3/uL (ref 0.7–3.1)
Lymphs: 42 %
MCH: 32.4 pg (ref 26.6–33.0)
MCHC: 33.8 g/dL (ref 31.5–35.7)
MCV: 96 fL (ref 79–97)
MONOS ABS: 0.6 10*3/uL (ref 0.1–0.9)
Monocytes: 11 %
NEUTROS ABS: 2.3 10*3/uL (ref 1.4–7.0)
NEUTROS PCT: 44 %
PLATELETS: 222 10*3/uL (ref 150–450)
RBC: 4.29 x10E6/uL (ref 4.14–5.80)
RDW: 15.4 % (ref 12.3–15.4)
WBC: 5.1 10*3/uL (ref 3.4–10.8)

## 2018-03-09 LAB — LIPID PANEL
CHOL/HDL RATIO: 2.1 ratio (ref 0.0–5.0)
CHOLESTEROL TOTAL: 92 mg/dL — AB (ref 100–199)
HDL: 43 mg/dL (ref >39)
LDL CALC: 36 mg/dL (ref 0–99)
TRIGLYCERIDES: 64 mg/dL (ref 0–149)
VLDL CHOLESTEROL CAL: 13 mg/dL (ref 5–40)

## 2018-03-09 LAB — BAYER DCA HB A1C WAIVED: HB A1C (BAYER DCA - WAIVED): 7.5 % — ABNORMAL HIGH (ref <7.0)

## 2018-03-09 LAB — PSA, TOTAL AND FREE
PROSTATE SPECIFIC AG, SERUM: 0.5 ng/mL (ref 0.0–4.0)
PSA, Free Pct: 48 %
PSA, Free: 0.24 ng/mL

## 2018-03-09 LAB — VITAMIN D 25 HYDROXY (VIT D DEFICIENCY, FRACTURES): Vit D, 25-Hydroxy: 78.6 ng/mL (ref 30.0–100.0)

## 2018-03-09 LAB — TSH: TSH: 66.93 u[IU]/mL — ABNORMAL HIGH (ref 0.450–4.500)

## 2018-03-09 MED ORDER — LEVOTHYROXINE SODIUM 50 MCG PO TABS: 50.0000 ug | ORAL_TABLET | Freq: Every day | ORAL | 1 refills | Status: DC

## 2018-03-31 LAB — HM DIABETES EYE EXAM

## 2018-04-22 DIAGNOSIS — R410 Disorientation, unspecified: Secondary | ICD-10-CM | POA: Diagnosis not present

## 2018-04-22 DIAGNOSIS — E162 Hypoglycemia, unspecified: Secondary | ICD-10-CM | POA: Diagnosis not present

## 2018-04-22 DIAGNOSIS — E161 Other hypoglycemia: Secondary | ICD-10-CM | POA: Diagnosis not present

## 2018-05-17 ENCOUNTER — Other Ambulatory Visit: Payer: Self-pay | Admitting: Family

## 2018-05-17 DIAGNOSIS — E559 Vitamin D deficiency, unspecified: Secondary | ICD-10-CM

## 2018-05-18 NOTE — Telephone Encounter (Signed)
Last Vit D 03/08/18  78.6

## 2018-06-10 ENCOUNTER — Ambulatory Visit: Payer: BLUE CROSS/BLUE SHIELD | Admitting: Family

## 2018-06-10 ENCOUNTER — Encounter: Payer: Self-pay | Admitting: Family

## 2018-06-10 VITALS — BP 92/51 | HR 93 | Temp 97.5°F | Ht 73.0 in | Wt 169.2 lb

## 2018-06-10 DIAGNOSIS — E559 Vitamin D deficiency, unspecified: Secondary | ICD-10-CM

## 2018-06-10 DIAGNOSIS — E1069 Type 1 diabetes mellitus with other specified complication: Secondary | ICD-10-CM | POA: Diagnosis not present

## 2018-06-10 DIAGNOSIS — E039 Hypothyroidism, unspecified: Secondary | ICD-10-CM | POA: Diagnosis not present

## 2018-06-10 DIAGNOSIS — E1065 Type 1 diabetes mellitus with hyperglycemia: Secondary | ICD-10-CM

## 2018-06-10 DIAGNOSIS — E782 Mixed hyperlipidemia: Secondary | ICD-10-CM | POA: Diagnosis not present

## 2018-06-10 LAB — BAYER DCA HB A1C WAIVED: HB A1C: 7.8 % — AB (ref <7.0)

## 2018-06-10 MED ORDER — DEXCOM RECEIVER KIT DEVI: 1.0000 | Freq: Four times a day (QID) | 0 refills | Status: DC

## 2018-06-10 NOTE — Progress Notes (Signed)
Subjective:    Patient ID: [name], male    DOB: 1971/07/02, 47 y.o.   MRN: [id number]  Chief Complaint  Patient presents with  . Medical Management of Chronic Issues   PT presents to the office to for chronic follow up.  Diabetes  He presents for his follow-up diabetic visit. He has type 1 diabetes mellitus. His disease course has been stable. There are no hypoglycemic associated symptoms. Associated symptoms include fatigue. Pertinent negatives for diabetes include no blurred vision, no foot paresthesias and no visual change. There are no hypoglycemic complications. Symptoms are stable. Pertinent negatives for diabetic complications include no CVA, heart disease or nephropathy. Risk factors for coronary artery disease include dyslipidemia, diabetes mellitus, male sex and sedentary lifestyle. He is following a generally healthy diet. His overall blood glucose range is 180-200 mg/dl. Eye exam is current.  Thyroid Problem  Presents for follow-up visit. Symptoms include constipation and fatigue. Patient reports no diarrhea or visual change. The symptoms have been stable. His past medical history is significant for hyperlipidemia.  Hyperlipidemia  This is a chronic problem. The current episode started more than 1 year ago. The problem is controlled. Recent lipid tests were reviewed and are normal. Current antihyperlipidemic treatment includes statins. The current treatment provides moderate improvement of lipids. Risk factors for coronary artery disease include dyslipidemia, diabetes mellitus, male sex and a sedentary lifestyle.      Review of Systems  Constitutional: Positive for fatigue.  Eyes: Negative for blurred vision.  Gastrointestinal: Positive for constipation. Negative for diarrhea.  All other systems reviewed and are negative.      Objective:   Physical Exam  Constitutional: He is oriented to person, place, and time. He appears well-developed and well-nourished. No  distress.  HENT:  Head: Normocephalic.  Right Ear: External ear normal.  Left Ear: External ear normal.  Mouth/Throat: Oropharynx is clear and moist.  Eyes: Pupils are equal, round, and reactive to light. Right eye exhibits no discharge. Left eye exhibits no discharge.  Neck: Normal range of motion. Neck supple. No thyromegaly present.  Cardiovascular: Normal rate, regular rhythm, normal heart sounds and intact distal pulses.  No murmur heard. Pulmonary/Chest: Effort normal and breath sounds normal. No respiratory distress. He has no wheezes.  Abdominal: Soft. Bowel sounds are normal. He exhibits no distension. There is no tenderness.  Musculoskeletal: Normal range of motion. He exhibits no edema or tenderness.  Neurological: He is alert and oriented to person, place, and time. He has normal reflexes. No cranial nerve deficit.  Skin: Skin is warm and dry. No rash noted. No erythema.  Psychiatric: He has a normal mood and affect. His behavior is normal. Judgment and thought content normal.  Vitals reviewed.     BP (!) 92/51   Pulse 93   Temp (!) 97.5 F (36.4 C) (Oral)   Ht '6\' 1"'  (1.854 m)   Wt 169 lb 3.2 oz (76.7 kg)   BMI 22.32 kg/m      Assessment & Plan:  [name] comes in today with chief complaint of Medical Management of Chronic Issues   Diagnosis and orders addressed:  1. Type 1 diabetes mellitus with hyperglycemia (HCC) - CMP14+EGFR - CBC with Differential/Platelet - Bayer DCA Hb A1c Waived - Microalbumin / creatinine urine ratio  2. 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 Acquired hypothyroidism - CMP14+EGFR - CBC with Differential/Platelet - TSH  4. Vitamin  D deficiency - CMP14+EGFR - CBC with Differential/Platelet   Labs pending Health Maintenance reviewed Diet and exercise encouraged  Follow up plan: 3 months   Evelina Dun, FNP

## 2018-06-10 NOTE — Patient Instructions (Signed)

## 2018-06-11 ENCOUNTER — Other Ambulatory Visit: Payer: Self-pay | Admitting: Family

## 2018-06-11 LAB — CBC WITH DIFFERENTIAL/PLATELET
BASOS ABS: 0 10*3/uL (ref 0.0–0.2)
Basos: 0 %
EOS (ABSOLUTE): 0.1 10*3/uL (ref 0.0–0.4)
Eos: 2 %
HEMOGLOBIN: 14.2 g/dL (ref 13.0–17.7)
Hematocrit: 42.5 % (ref 37.5–51.0)
Immature Grans (Abs): 0 10*3/uL (ref 0.0–0.1)
Immature Granulocytes: 0 %
LYMPHS ABS: 2.9 10*3/uL (ref 0.7–3.1)
Lymphs: 35 %
MCH: 31.7 pg (ref 26.6–33.0)
MCHC: 33.4 g/dL (ref 31.5–35.7)
MCV: 95 fL (ref 79–97)
MONOCYTES: 9 %
MONOS ABS: 0.7 10*3/uL (ref 0.1–0.9)
Neutrophils Absolute: 4.4 10*3/uL (ref 1.4–7.0)
Neutrophils: 54 %
PLATELETS: 262 10*3/uL (ref 150–450)
RBC: 4.48 x10E6/uL (ref 4.14–5.80)
RDW: 13.6 % (ref 12.3–15.4)
WBC: 8.1 10*3/uL (ref 3.4–10.8)

## 2018-06-11 LAB — CMP14+EGFR
ALK PHOS: 69 IU/L (ref 39–117)
ALT: 22 IU/L (ref 0–44)
AST: 27 IU/L (ref 0–40)
Albumin/Globulin Ratio: 1.8 (ref 1.2–2.2)
Albumin: 4.4 g/dL (ref 3.5–5.5)
BILIRUBIN TOTAL: 0.7 mg/dL (ref 0.0–1.2)
BUN/Creatinine Ratio: 15 (ref 9–20)
BUN: 19 mg/dL (ref 6–24)
CHLORIDE: 100 mmol/L (ref 96–106)
CO2: 23 mmol/L (ref 20–29)
Calcium: 9.2 mg/dL (ref 8.7–10.2)
Creatinine, Ser: 1.23 mg/dL (ref 0.76–1.27)
GFR calc Af Amer: 81 mL/min/{1.73_m2} (ref >59)
GFR calc non Af Amer: 70 mL/min/{1.73_m2} (ref >59)
GLUCOSE: 50 mg/dL — AB (ref 65–99)
Globulin, Total: 2.5 g/dL (ref 1.5–4.5)
POTASSIUM: 4.6 mmol/L (ref 3.5–5.2)
Sodium: 137 mmol/L (ref 134–144)
Total Protein: 6.9 g/dL (ref 6.0–8.5)

## 2018-06-11 LAB — TSH: TSH: 32.15 u[IU]/mL — ABNORMAL HIGH (ref 0.450–4.500)

## 2018-06-11 MED ORDER — LEVOTHYROXINE SODIUM 100 MCG PO TABS: 100.0000 ug | ORAL_TABLET | Freq: Every day | ORAL | 1 refills | Status: DC

## 2018-06-15 ENCOUNTER — Other Ambulatory Visit: Payer: Self-pay | Admitting: Family

## 2018-06-15 DIAGNOSIS — E1065 Type 1 diabetes mellitus with hyperglycemia: Secondary | ICD-10-CM

## 2018-06-18 ENCOUNTER — Other Ambulatory Visit: Payer: Self-pay | Admitting: Family

## 2018-06-18 DIAGNOSIS — E559 Vitamin D deficiency, unspecified: Secondary | ICD-10-CM

## 2018-06-23 ENCOUNTER — Telehealth: Payer: Self-pay | Admitting: *Deleted

## 2018-06-23 DIAGNOSIS — E782 Mixed hyperlipidemia: Secondary | ICD-10-CM

## 2018-06-23 MED ORDER — ROSUVASTATIN CALCIUM 20 MG PO TABS: 20.0000 mg | ORAL_TABLET | Freq: Every day | ORAL | 0 refills | Status: DC

## 2018-06-23 NOTE — Telephone Encounter (Signed)
Pt aware cholesterol med sent to pharmacy & thyroid was sent on 06/11/18. Reminded pt to come back in January to have Thyroid levels rechecked

## 2018-07-01 ENCOUNTER — Other Ambulatory Visit: Payer: Self-pay | Admitting: *Deleted

## 2018-07-01 DIAGNOSIS — E559 Vitamin D deficiency, unspecified: Secondary | ICD-10-CM

## 2018-07-01 MED ORDER — VITAMIN D (ERGOCALCIFEROL) 1.25 MG (50000 UNIT) PO CAPS: 50000.0000 [IU] | ORAL_CAPSULE | ORAL | 1 refills | Status: DC

## 2018-07-19 ENCOUNTER — Telehealth: Payer: Self-pay | Admitting: Family

## 2018-07-19 DIAGNOSIS — E1065 Type 1 diabetes mellitus with hyperglycemia: Secondary | ICD-10-CM

## 2018-07-19 MED ORDER — FREESTYLE LIBRE READER DEVI: 1.0000 | Freq: Every day | 0 refills | Status: DC

## 2018-07-19 NOTE — Telephone Encounter (Signed)
Pt aware reader sent over to pharmacy.

## 2018-08-29 ENCOUNTER — Other Ambulatory Visit: Payer: Self-pay | Admitting: Family

## 2018-08-29 DIAGNOSIS — E1065 Type 1 diabetes mellitus with hyperglycemia: Secondary | ICD-10-CM

## 2018-09-14 ENCOUNTER — Ambulatory Visit: Payer: BLUE CROSS/BLUE SHIELD | Admitting: Family

## 2018-09-27 ENCOUNTER — Ambulatory Visit (INDEPENDENT_AMBULATORY_CARE_PROVIDER_SITE_OTHER): Payer: BLUE CROSS/BLUE SHIELD | Admitting: Family

## 2018-09-27 ENCOUNTER — Encounter: Payer: Self-pay | Admitting: Family

## 2018-09-27 VITALS — BP 82/48 | HR 89 | Temp 97.0°F | Ht 73.0 in | Wt 167.0 lb

## 2018-09-27 DIAGNOSIS — E1069 Type 1 diabetes mellitus with other specified complication: Secondary | ICD-10-CM

## 2018-09-27 DIAGNOSIS — E782 Mixed hyperlipidemia: Secondary | ICD-10-CM

## 2018-09-27 DIAGNOSIS — E039 Hypothyroidism, unspecified: Secondary | ICD-10-CM

## 2018-09-27 DIAGNOSIS — E1065 Type 1 diabetes mellitus with hyperglycemia: Secondary | ICD-10-CM

## 2018-09-27 DIAGNOSIS — E559 Vitamin D deficiency, unspecified: Secondary | ICD-10-CM | POA: Diagnosis not present

## 2018-09-27 LAB — BAYER DCA HB A1C WAIVED: HB A1C (BAYER DCA - WAIVED): 7.4 % — ABNORMAL HIGH (ref <7.0)

## 2018-09-27 NOTE — Progress Notes (Signed)
Subjective:    Patient ID: [name], male    DOB: 08-16-1970, 48 y.o.   MRN: [id number]  Chief Complaint  Patient presents with  . Medical Management of Chronic Issues    three month recheck    Diabetes  He presents for his follow-up diabetic visit. He has type 2 diabetes mellitus. There are no hypoglycemic associated symptoms. Associated symptoms include fatigue. Pertinent negatives for diabetes include no foot paresthesias and no visual change. There are no hypoglycemic complications. Symptoms are stable. Pertinent negatives for diabetic complications include no CVA, heart disease, nephropathy or peripheral neuropathy. Risk factors for coronary artery disease include diabetes mellitus, dyslipidemia, male sex, hypertension and sedentary lifestyle. He is following a generally unhealthy diet. Eye exam is not current.  Thyroid Problem  Presents for follow-up visit. Symptoms include fatigue. Patient reports no constipation, dry skin, hoarse voice or visual change. The symptoms have been stable. His past medical history is significant for hyperlipidemia.  Hyperlipidemia  This is a chronic problem. The current episode started more than 1 year ago. The problem is controlled. Recent lipid tests were reviewed and are normal. Current antihyperlipidemic treatment includes statins. The current treatment provides moderate improvement of lipids. Risk factors for coronary artery disease include dyslipidemia, diabetes mellitus, hypertension and a sedentary lifestyle.      Review of Systems  Constitutional: Positive for fatigue.  HENT: Negative for hoarse voice.   Gastrointestinal: Negative for constipation.  All other systems reviewed and are negative.      Objective:   Physical Exam Vitals signs reviewed.  Constitutional:      General: He is not in acute distress.    Appearance: He is well-developed.  HENT:     Head: Normocephalic.     Right Ear: Tympanic membrane normal.     Left  Ear: Tympanic membrane normal.  Eyes:     General:        Right eye: No discharge.        Left eye: No discharge.     Pupils: Pupils are equal, round, and reactive to light.  Neck:     Musculoskeletal: Normal range of motion and neck supple.     Thyroid: No thyromegaly.  Cardiovascular:     Rate and Rhythm: Normal rate and regular rhythm.     Heart sounds: Normal heart sounds. No murmur.  Pulmonary:     Effort: Pulmonary effort is normal. No respiratory distress.     Breath sounds: Normal breath sounds. No wheezing.  Abdominal:     General: Bowel sounds are normal. There is no distension.     Palpations: Abdomen is soft.     Tenderness: There is no abdominal tenderness.  Musculoskeletal: Normal range of motion.        General: No tenderness.  Skin:    General: Skin is warm and dry.     Findings: No erythema or rash.  Neurological:     Mental Status: He is alert and oriented to person, place, and time.     Cranial Nerves: No cranial nerve deficit.     Deep Tendon Reflexes: Reflexes are normal and symmetric.  Psychiatric:        Behavior: Behavior normal.        Thought Content: Thought content normal.        Judgment: Judgment normal.      BP (!) 82/48   Pulse 89   Temp (!) 97 F (36.1 C) (Oral)   Ht '6\' 1"'  (  1.854 m)   Wt 167 lb (75.8 kg)   BMI 22.03 kg/m      Assessment & Plan:  [name] comes in today with chief complaint of Medical Management of Chronic Issues (three month recheck)   Diagnosis and orders addressed:  1. Type 1 diabetes mellitus with hyperglycemia (HCC) - Bayer DCA Hb A1c Waived - CMP14+EGFR - Microalbumin / creatinine urine ratio  2. Acquired hypothyroidism - CMP14+EGFR - TSH  3. 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 Vitamin D deficiency - CMP14+EGFR   Labs pending Health Maintenance reviewed Diet and exercise encouraged  Follow up plan: 3 months    Evelina Dun, FNP

## 2018-09-27 NOTE — Patient Instructions (Signed)
Hypothyroidism  Hypothyroidism is when the thyroid gland does not make enough of certain hormones (it is underactive). The thyroid gland is a small gland located in the lower front part of the neck, just in front of the windpipe (trachea). This gland makes hormones that help control how the body uses food for energy (metabolism) as well as how the heart and brain function. These hormones also play a role in keeping your bones strong. When the thyroid is underactive, it produces too little of the hormones thyroxine (T4) and triiodothyronine (T3). What are the causes? This condition may be caused by:  Hashimoto's disease. This is a disease in which the body's disease-fighting system (immune system) attacks the thyroid gland. This is the most common cause.  Viral infections.  Pregnancy.  Certain medicines.  Birth defects.  Past radiation treatments to the head or neck for cancer.  Past treatment with radioactive iodine.  Past exposure to radiation in the environment.  Past surgical removal of part or all of the thyroid.  Problems with a gland in the center of the brain (pituitary gland).  Lack of enough iodine in the diet. What increases the risk? You are more likely to develop this condition if:  You are male.  You have a family history of thyroid conditions.  You use a medicine called lithium.  You take medicines that affect the immune system (immunosuppressants). What are the signs or symptoms? Symptoms of this condition include:  Feeling as though you have no energy (lethargy).  Not being able to tolerate cold.  Weight gain that is not explained by a change in diet or exercise habits.  Lack of appetite.  Dry skin.  Coarse hair.  Menstrual irregularity.  Slowing of thought processes.  Constipation.  Sadness or depression. How is this diagnosed? This condition may be diagnosed based on:  Your symptoms, your medical history, and a physical exam.  Blood  tests. You may also have imaging tests, such as an ultrasound or MRI. How is this treated? This condition is treated with medicine that replaces the thyroid hormones that your body does not make. After you begin treatment, it may take several weeks for symptoms to go away. Follow these instructions at home:  Take over-the-counter and prescription medicines only as told by your health care provider.  If you start taking any new medicines, tell your health care provider.  Keep all follow-up visits as told by your health care provider. This is important. ? As your condition improves, your dosage of thyroid hormone medicine may change. ? You will need to have blood tests regularly so that your health care provider can monitor your condition. Contact a health care provider if:  Your symptoms do not get better with treatment.  You are taking thyroid replacement medicine and you: ? Sweat a lot. ? Have tremors. ? Feel anxious. ? Lose weight rapidly. ? Cannot tolerate heat. ? Have emotional swings. ? Have diarrhea. ? Feel weak. Get help right away if you have:  Chest pain.  An irregular heartbeat.  A rapid heartbeat.  Difficulty breathing. Summary  Hypothyroidism is when the thyroid gland does not make enough of certain hormones (it is underactive).  When the thyroid is underactive, it produces too little of the hormones thyroxine (T4) and triiodothyronine (T3).  The most common cause is Hashimoto's disease, a disease in which the body's disease-fighting system (immune system) attacks the thyroid gland. The condition can also be caused by viral infections, medicine, pregnancy, or past   radiation treatment to the head or neck.  Symptoms may include weight gain, dry skin, constipation, feeling as though you do not have energy, and not being able to tolerate cold.  This condition is treated with medicine to replace the thyroid hormones that your body does not make. This information  is not intended to replace advice given to you by your health care provider. Make sure you discuss any questions you have with your health care provider. Document Released: 07/14/2005 Document Revised: 06/24/2017 Document Reviewed: 06/24/2017 Elsevier Interactive Patient Education  2019 [employer]  

## 2018-09-28 DIAGNOSIS — E162 Hypoglycemia, unspecified: Secondary | ICD-10-CM | POA: Diagnosis not present

## 2018-09-28 DIAGNOSIS — R61 Generalized hyperhidrosis: Secondary | ICD-10-CM | POA: Diagnosis not present

## 2018-09-28 DIAGNOSIS — E161 Other hypoglycemia: Secondary | ICD-10-CM | POA: Diagnosis not present

## 2018-09-28 DIAGNOSIS — R456 Violent behavior: Secondary | ICD-10-CM | POA: Diagnosis not present

## 2018-09-28 LAB — CMP14+EGFR
ALBUMIN: 4.1 g/dL (ref 4.0–5.0)
ALT: 25 IU/L (ref 0–44)
AST: 33 IU/L (ref 0–40)
Albumin/Globulin Ratio: 1.8 (ref 1.2–2.2)
Alkaline Phosphatase: 87 IU/L (ref 39–117)
BUN/Creatinine Ratio: 15 (ref 9–20)
BUN: 16 mg/dL (ref 6–24)
Bilirubin Total: 0.5 mg/dL (ref 0.0–1.2)
CO2: 26 mmol/L (ref 20–29)
CREATININE: 1.05 mg/dL (ref 0.76–1.27)
Calcium: 9.4 mg/dL (ref 8.7–10.2)
Chloride: 103 mmol/L (ref 96–106)
GFR calc Af Amer: 98 mL/min/{1.73_m2} (ref >59)
GFR calc non Af Amer: 85 mL/min/{1.73_m2} (ref >59)
Globulin, Total: 2.3 g/dL (ref 1.5–4.5)
Glucose: 79 mg/dL (ref 65–99)
Potassium: 4.2 mmol/L (ref 3.5–5.2)
Sodium: 141 mmol/L (ref 134–144)
Total Protein: 6.4 g/dL (ref 6.0–8.5)

## 2018-09-28 LAB — MICROALBUMIN / CREATININE URINE RATIO
Creatinine, Urine: 188.2 mg/dL
Microalb/Creat Ratio: 4 mg/g creat (ref 0–29)
Microalbumin, Urine: 6.7 ug/mL

## 2018-09-28 LAB — TSH: TSH: 1.58 u[IU]/mL (ref 0.450–4.500)

## 2018-09-30 ENCOUNTER — Encounter: Payer: Self-pay | Admitting: Family Medicine

## 2018-09-30 ENCOUNTER — Ambulatory Visit (INDEPENDENT_AMBULATORY_CARE_PROVIDER_SITE_OTHER): Payer: BLUE CROSS/BLUE SHIELD | Admitting: Family Medicine

## 2018-09-30 VITALS — BP 93/57 | HR 90 | Temp 98.4°F | Ht 73.0 in | Wt 165.0 lb

## 2018-09-30 DIAGNOSIS — R52 Pain, unspecified: Secondary | ICD-10-CM | POA: Diagnosis not present

## 2018-09-30 DIAGNOSIS — J069 Acute upper respiratory infection, unspecified: Secondary | ICD-10-CM

## 2018-09-30 LAB — CULTURE, GROUP A STREP

## 2018-09-30 LAB — VERITOR FLU A/B WAIVED
Influenza A: NEGATIVE
Influenza B: NEGATIVE

## 2018-09-30 LAB — RAPID STREP SCREEN (MED CTR MEBANE ONLY): Strep Gp A Ag, IA W/Reflex: NEGATIVE

## 2018-09-30 MED ORDER — AZITHROMYCIN 250 MG PO TABS: 250.0000 mg | ORAL_TABLET | Freq: Every day | ORAL | 0 refills | Status: DC

## 2018-09-30 NOTE — Progress Notes (Signed)
    Subjective:     [name] is a 48 y.o. male who presents for evaluation of symptoms of a URI. Symptoms include achiness, congestion, coryza, cough described as nonproductive, low grade fever, nasal congestion, non productive cough and sore throat. Onset of symptoms was 3 days ago, and has been unchanged since that time. Treatment to date: cough suppressants.  The following portions of the patient's history were reviewed and updated as appropriate: allergies, current medications, past family history, past medical history, past social history, past surgical history and problem list.  Review of Systems Constitutional: positive for chills, fatigue and malaise Eyes: negative Ears, nose, mouth, throat, and face: positive for earaches, nasal congestion and sore throat Respiratory: positive for cough Cardiovascular: negative Gastrointestinal: negative Musculoskeletal:positive for myalgias Neurological: positive for headaches   Objective:    BP (!) 93/57   Pulse 90   Temp 98.4 F (36.9 C) (Oral)   Ht 6\' 1"  (1.854 m)   Wt 165 lb (74.8 kg)   BMI 21.77 kg/m  General appearance: alert, cooperative and mild distress Head: Normocephalic, without obvious abnormality, atraumatic Eyes: negative Ears: abnormal TM right ear - serous middle ear fluid and abnormal TM left ear - serous middle ear fluid Nose: scant and yellow discharge, mild congestion, turbinates red, swollen, no sinus tenderness Throat: abnormal findings: mild oropharyngeal erythema Lungs: clear to auscultation bilaterally Heart: regular rate and rhythm, S1, S2 normal, no murmur, click, rub or gallop Pulses: 2+ and symmetric Neurologic: Grossly normal    Strep negative Influenza negative  Assessment:      [name] was seen today for body aches, sore throat, headache.  Diagnoses and all orders for this visit:  URI with cough and congestion Symptomatic care discussed. Due to comorbidities, will treat with  antibiotics. Report any new or worsening symptoms.  -     azithromycin (ZITHROMAX) 250 MG tablet; Take 1 tablet (250 mg total) by mouth daily.  Body aches Strep and influenza negative in office.  -     Veritor Flu A/B Waived -     Rapid Strep Screen ([facility] ONLY)     Plan:    Discussed diagnosis and treatment of URI. Suggested symptomatic OTC remedies. Nasal saline spray for congestion. Zithromax per orders. Follow up as needed.    The above assessment and management plan was discussed with the patient. The patient verbalized understanding of and has agreed to the management plan. Patient is aware to call the clinic if symptoms fail to improve or worsen. Patient is aware when to return to the clinic for a follow-up visit. Patient educated on when it is appropriate to go to the emergency department.   Kari Baars, FNP-C [facility] [address] [place], [place] [address] [phone]

## 2018-09-30 NOTE — Patient Instructions (Signed)

## 2018-10-29 ENCOUNTER — Other Ambulatory Visit: Payer: Self-pay | Admitting: Family

## 2018-10-29 ENCOUNTER — Other Ambulatory Visit: Payer: Self-pay | Admitting: *Deleted

## 2018-10-29 DIAGNOSIS — E782 Mixed hyperlipidemia: Secondary | ICD-10-CM

## 2018-10-29 MED ORDER — ROSUVASTATIN CALCIUM 20 MG PO TABS: 20.0000 mg | ORAL_TABLET | Freq: Every day | ORAL | 0 refills | Status: DC

## 2018-10-29 NOTE — Telephone Encounter (Signed)
What is the name of the medication? rosuvastatin (CRESTOR) 20 MG tablet Rosuvastatin Calcium 20 MG Oral Tablet]  Have you contacted your pharmacy to request a refill? Yes  Which pharmacy would you like this sent to? [facility]   Patient notified that their request is being sent to the clinical staff for review and that they should receive a call once it is complete. If they do not receive a call within 24 hours they can check with their pharmacy or our office.

## 2018-11-18 ENCOUNTER — Other Ambulatory Visit: Payer: Self-pay | Admitting: Family

## 2018-11-18 DIAGNOSIS — E1065 Type 1 diabetes mellitus with hyperglycemia: Secondary | ICD-10-CM

## 2018-12-28 ENCOUNTER — Other Ambulatory Visit: Payer: Self-pay

## 2018-12-28 ENCOUNTER — Encounter: Payer: Self-pay | Admitting: Family

## 2018-12-28 ENCOUNTER — Ambulatory Visit (INDEPENDENT_AMBULATORY_CARE_PROVIDER_SITE_OTHER): Payer: BC Managed Care – PPO | Admitting: Family

## 2018-12-28 DIAGNOSIS — E1069 Type 1 diabetes mellitus with other specified complication: Secondary | ICD-10-CM

## 2018-12-28 DIAGNOSIS — E039 Hypothyroidism, unspecified: Secondary | ICD-10-CM | POA: Diagnosis not present

## 2018-12-28 DIAGNOSIS — E1065 Type 1 diabetes mellitus with hyperglycemia: Secondary | ICD-10-CM | POA: Diagnosis not present

## 2018-12-28 DIAGNOSIS — E559 Vitamin D deficiency, unspecified: Secondary | ICD-10-CM | POA: Diagnosis not present

## 2018-12-28 DIAGNOSIS — E782 Mixed hyperlipidemia: Secondary | ICD-10-CM

## 2018-12-28 NOTE — Progress Notes (Signed)
   Virtual Visit via telephone Note  I connected with [name] on 12/28/18 at 8:13AM by telephone and verified that I am speaking with the correct person using two identifiers. [name] is currently located at driving and no one is currently with her during visit. The provider, Jannifer Rodney, FNP is located in their office at time of visit.  I discussed the limitations, risks, security and privacy concerns of performing an evaluation and management service by telephone and the availability of in person appointments. I also discussed with the patient that there may be a patient responsible charge related to this service. The patient expressed understanding and agreed to proceed.   History and Present Illness:  PT calls the office today for chronic follow up.  Diabetes  He presents for his follow-up diabetic visit. He has type 1 diabetes mellitus. Pertinent negatives for diabetes include no fatigue, no foot paresthesias, no foot ulcerations and no visual change. Symptoms are stable. Pertinent negatives for diabetic complications include no CVA, heart disease, nephropathy or peripheral neuropathy. Risk factors for coronary artery disease include dyslipidemia, diabetes mellitus and male sex. He is following a generally healthy diet. His overall blood glucose range is 180-200 mg/dl. Eye exam is current.  Hyperlipidemia  This is a chronic problem. The current episode started more than 1 year ago. The problem is controlled. Recent lipid tests were reviewed and are normal. Exacerbating diseases include hypothyroidism. Current antihyperlipidemic treatment includes statins. The current treatment provides moderate improvement of lipids. Risk factors for coronary artery disease include dyslipidemia, diabetes mellitus, male sex, hypertension and a sedentary lifestyle.  Thyroid Problem  Presents for follow-up visit. Patient reports no constipation, diarrhea, fatigue or visual change. The symptoms  have been stable. His past medical history is significant for hyperlipidemia.      Review of Systems  Constitutional: Negative for fatigue.  Gastrointestinal: Negative for constipation and diarrhea.     Observations/Objective: No SOB or distress noted  Assessment and Plan: [name] comes in today with chief complaint of No chief complaint on file.   Diagnosis and orders addressed:  1. Type 1 diabetes mellitus with hyperglycemia (HCC)  2. Acquired hypothyroidism  3. 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 Vitamin D deficiency    Labs reviewed from previous visit, will get labs on next visit Health Maintenance reviewed Diet and exercise encouraged  Follow up plan: 3 months      I discussed the assessment and treatment plan with the patient. The patient was provided an opportunity to ask questions and all were answered. The patient agreed with the plan and demonstrated an understanding of the instructions.   The patient was advised to call back or seek an in-person evaluation if the symptoms worsen or if the condition fails to improve as anticipated.  The above assessment and management plan was discussed with the patient. The patient verbalized understanding of and has agreed to the management plan. Patient is aware to call the clinic if symptoms persist or worsen. Patient is aware when to return to the clinic for a follow-up visit. Patient educated on when it is appropriate to go to the emergency department.   Time call ended:  8:27AM  I provided 14 minutes of non-face-to-face time during this encounter.    Jannifer Rodney, FNP

## 2019-01-26 ENCOUNTER — Other Ambulatory Visit: Payer: Self-pay | Admitting: Family

## 2019-01-26 DIAGNOSIS — E1065 Type 1 diabetes mellitus with hyperglycemia: Secondary | ICD-10-CM

## 2019-01-26 DIAGNOSIS — E559 Vitamin D deficiency, unspecified: Secondary | ICD-10-CM

## 2019-02-18 ENCOUNTER — Other Ambulatory Visit: Payer: Self-pay | Admitting: Family

## 2019-02-18 DIAGNOSIS — E1065 Type 1 diabetes mellitus with hyperglycemia: Secondary | ICD-10-CM

## 2019-02-21 ENCOUNTER — Telehealth: Payer: Self-pay | Admitting: Family

## 2019-02-23 DIAGNOSIS — M9901 Segmental and somatic dysfunction of cervical region: Secondary | ICD-10-CM | POA: Diagnosis not present

## 2019-02-23 DIAGNOSIS — M546 Pain in thoracic spine: Secondary | ICD-10-CM | POA: Diagnosis not present

## 2019-02-23 DIAGNOSIS — M9902 Segmental and somatic dysfunction of thoracic region: Secondary | ICD-10-CM | POA: Diagnosis not present

## 2019-02-23 DIAGNOSIS — M542 Cervicalgia: Secondary | ICD-10-CM | POA: Diagnosis not present

## 2019-02-25 DIAGNOSIS — M546 Pain in thoracic spine: Secondary | ICD-10-CM | POA: Diagnosis not present

## 2019-02-25 DIAGNOSIS — M9902 Segmental and somatic dysfunction of thoracic region: Secondary | ICD-10-CM | POA: Diagnosis not present

## 2019-02-25 DIAGNOSIS — M9901 Segmental and somatic dysfunction of cervical region: Secondary | ICD-10-CM | POA: Diagnosis not present

## 2019-02-25 DIAGNOSIS — M542 Cervicalgia: Secondary | ICD-10-CM | POA: Diagnosis not present

## 2019-03-02 DIAGNOSIS — M9901 Segmental and somatic dysfunction of cervical region: Secondary | ICD-10-CM | POA: Diagnosis not present

## 2019-03-02 DIAGNOSIS — M9902 Segmental and somatic dysfunction of thoracic region: Secondary | ICD-10-CM | POA: Diagnosis not present

## 2019-03-02 DIAGNOSIS — M546 Pain in thoracic spine: Secondary | ICD-10-CM | POA: Diagnosis not present

## 2019-03-02 DIAGNOSIS — M542 Cervicalgia: Secondary | ICD-10-CM | POA: Diagnosis not present

## 2019-03-04 DIAGNOSIS — M542 Cervicalgia: Secondary | ICD-10-CM | POA: Diagnosis not present

## 2019-03-04 DIAGNOSIS — M9902 Segmental and somatic dysfunction of thoracic region: Secondary | ICD-10-CM | POA: Diagnosis not present

## 2019-03-04 DIAGNOSIS — M546 Pain in thoracic spine: Secondary | ICD-10-CM | POA: Diagnosis not present

## 2019-03-04 DIAGNOSIS — M9901 Segmental and somatic dysfunction of cervical region: Secondary | ICD-10-CM | POA: Diagnosis not present

## 2019-03-07 DIAGNOSIS — M9902 Segmental and somatic dysfunction of thoracic region: Secondary | ICD-10-CM | POA: Diagnosis not present

## 2019-03-07 DIAGNOSIS — M9901 Segmental and somatic dysfunction of cervical region: Secondary | ICD-10-CM | POA: Diagnosis not present

## 2019-03-07 DIAGNOSIS — M546 Pain in thoracic spine: Secondary | ICD-10-CM | POA: Diagnosis not present

## 2019-03-07 DIAGNOSIS — M542 Cervicalgia: Secondary | ICD-10-CM | POA: Diagnosis not present

## 2019-03-09 DIAGNOSIS — M9902 Segmental and somatic dysfunction of thoracic region: Secondary | ICD-10-CM | POA: Diagnosis not present

## 2019-03-09 DIAGNOSIS — M546 Pain in thoracic spine: Secondary | ICD-10-CM | POA: Diagnosis not present

## 2019-03-09 DIAGNOSIS — M9901 Segmental and somatic dysfunction of cervical region: Secondary | ICD-10-CM | POA: Diagnosis not present

## 2019-03-09 DIAGNOSIS — M542 Cervicalgia: Secondary | ICD-10-CM | POA: Diagnosis not present

## 2019-03-14 DIAGNOSIS — M546 Pain in thoracic spine: Secondary | ICD-10-CM | POA: Diagnosis not present

## 2019-03-14 DIAGNOSIS — M9902 Segmental and somatic dysfunction of thoracic region: Secondary | ICD-10-CM | POA: Diagnosis not present

## 2019-03-14 DIAGNOSIS — M9901 Segmental and somatic dysfunction of cervical region: Secondary | ICD-10-CM | POA: Diagnosis not present

## 2019-03-14 DIAGNOSIS — M542 Cervicalgia: Secondary | ICD-10-CM | POA: Diagnosis not present

## 2019-03-17 ENCOUNTER — Other Ambulatory Visit: Payer: Self-pay

## 2019-03-17 DIAGNOSIS — Z20822 Contact with and (suspected) exposure to covid-19: Secondary | ICD-10-CM

## 2019-03-17 NOTE — Progress Notes (Signed)
[id number] 

## 2019-03-18 LAB — NOVEL CORONAVIRUS, NAA: SARS-CoV-2, NAA: NOT DETECTED

## 2019-03-31 ENCOUNTER — Ambulatory Visit: Payer: BLUE CROSS/BLUE SHIELD | Admitting: Family

## 2019-04-01 DIAGNOSIS — M542 Cervicalgia: Secondary | ICD-10-CM | POA: Diagnosis not present

## 2019-04-01 DIAGNOSIS — M9901 Segmental and somatic dysfunction of cervical region: Secondary | ICD-10-CM | POA: Diagnosis not present

## 2019-04-01 DIAGNOSIS — M9902 Segmental and somatic dysfunction of thoracic region: Secondary | ICD-10-CM | POA: Diagnosis not present

## 2019-04-01 DIAGNOSIS — M546 Pain in thoracic spine: Secondary | ICD-10-CM | POA: Diagnosis not present

## 2019-04-06 DIAGNOSIS — M542 Cervicalgia: Secondary | ICD-10-CM | POA: Diagnosis not present

## 2019-04-06 DIAGNOSIS — M9902 Segmental and somatic dysfunction of thoracic region: Secondary | ICD-10-CM | POA: Diagnosis not present

## 2019-04-06 DIAGNOSIS — M9901 Segmental and somatic dysfunction of cervical region: Secondary | ICD-10-CM | POA: Diagnosis not present

## 2019-04-06 DIAGNOSIS — M546 Pain in thoracic spine: Secondary | ICD-10-CM | POA: Diagnosis not present

## 2019-04-27 ENCOUNTER — Other Ambulatory Visit: Payer: Self-pay

## 2019-04-28 ENCOUNTER — Ambulatory Visit: Payer: BC Managed Care – PPO | Admitting: Family

## 2019-04-28 ENCOUNTER — Encounter: Payer: Self-pay | Admitting: Family

## 2019-04-28 VITALS — BP 98/61 | HR 87 | Temp 96.5°F | Ht 73.0 in | Wt 163.2 lb

## 2019-04-28 DIAGNOSIS — Z0001 Encounter for general adult medical examination with abnormal findings: Secondary | ICD-10-CM | POA: Diagnosis not present

## 2019-04-28 DIAGNOSIS — E782 Mixed hyperlipidemia: Secondary | ICD-10-CM | POA: Diagnosis not present

## 2019-04-28 DIAGNOSIS — E1069 Type 1 diabetes mellitus with other specified complication: Secondary | ICD-10-CM

## 2019-04-28 DIAGNOSIS — Z0000 Encounter for general adult medical examination without abnormal findings: Secondary | ICD-10-CM | POA: Diagnosis not present

## 2019-04-28 DIAGNOSIS — E1065 Type 1 diabetes mellitus with hyperglycemia: Secondary | ICD-10-CM

## 2019-04-28 DIAGNOSIS — E039 Hypothyroidism, unspecified: Secondary | ICD-10-CM | POA: Diagnosis not present

## 2019-04-28 DIAGNOSIS — E559 Vitamin D deficiency, unspecified: Secondary | ICD-10-CM

## 2019-04-28 LAB — BAYER DCA HB A1C WAIVED: HB A1C (BAYER DCA - WAIVED): 7.7 % — ABNORMAL HIGH (ref <7.0)

## 2019-04-28 MED ORDER — FREESTYLE LIBRE 14 DAY SENSOR MISC: 1.0000 | 3 refills | Status: DC

## 2019-04-28 NOTE — Patient Instructions (Signed)

## 2019-04-28 NOTE — Progress Notes (Addendum)
Subjective:    Patient ID: [name], male    DOB: Jun 08, 1971, 48 y.o.   MRN: [id number]  Chief Complaint  Patient presents with  . Medical Management of Chronic Issues   Pt presents to the office today for CPE.  Diabetes He presents for his follow-up diabetic visit. He has type 1 diabetes mellitus. His disease course has been stable. There are no hypoglycemic associated symptoms. Associated symptoms include fatigue. Pertinent negatives for diabetes include no blurred vision, no foot paresthesias and no visual change. There are no hypoglycemic complications. Symptoms are stable. Pertinent negatives for diabetic complications include no CVA, heart disease, nephropathy or peripheral neuropathy. Risk factors for coronary artery disease include family history and obesity. His overall blood glucose range is 180-200 mg/dl. Eye exam is current.  Thyroid Problem Presents for follow-up visit. Symptoms include fatigue. Patient reports no constipation, depressed mood, diarrhea or visual change. The symptoms have been stable. His past medical history is significant for hyperlipidemia.  Hyperlipidemia This is a chronic problem. The current episode started more than 1 year ago. The problem is controlled. Recent lipid tests were reviewed and are normal. Current antihyperlipidemic treatment includes statins. The current treatment provides moderate improvement of lipids. Risk factors for coronary artery disease include dyslipidemia, male sex and hypertension.      Review of Systems  Constitutional: Positive for fatigue.  Eyes: Negative for blurred vision.  Gastrointestinal: Negative for constipation and diarrhea.  All other systems reviewed and are negative.  Family History  Problem Relation Age of Onset  . Muscular dystrophy Mother   . Diabetes Father     Social History   Socioeconomic History  . Marital status: Married    Spouse name: Not on file  . Number of children: Not on file  .  Years of education: Not on file  . Highest education level: Not on file  Occupational History  . Not on file  Social Needs  . Financial resource strain: Not on file  . Food insecurity    Worry: Not on file    Inability: Not on file  . Transportation needs    Medical: Not on file    Non-medical: Not on file  Tobacco Use  . Smoking status: Never Smoker  . Smokeless tobacco: Never Used  Substance and Sexual Activity  . Alcohol use: No  . Drug use: No  . Sexual activity: Yes  Lifestyle  . Physical activity    Days per week: Not on file    Minutes per session: Not on file  . Stress: Not on file  Relationships  . Social Herbalist on phone: Not on file    Gets together: Not on file    Attends religious service: Not on file    Active member of club or organization: Not on file    Attends meetings of clubs or organizations: Not on file    Relationship status: Not on file  Other Topics Concern  . Not on file  Social History Narrative  . Not on file       Objective:   Physical Exam Vitals signs reviewed.  Constitutional:      General: He is not in acute distress.    Appearance: He is well-developed.  HENT:     Head: Normocephalic.     Right Ear: Tympanic membrane normal.     Left Ear: Tympanic membrane normal.  Eyes:     General:  Right eye: No discharge.        Left eye: No discharge.     Pupils: Pupils are equal, round, and reactive to light.  Neck:     Musculoskeletal: Normal range of motion and neck supple.     Thyroid: No thyromegaly.  Cardiovascular:     Rate and Rhythm: Normal rate and regular rhythm.     Heart sounds: Normal heart sounds. No murmur.  Pulmonary:     Effort: Pulmonary effort is normal. No respiratory distress.     Breath sounds: Normal breath sounds. No wheezing.  Abdominal:     General: Bowel sounds are normal. There is no distension.     Palpations: Abdomen is soft.     Tenderness: There is no abdominal tenderness.   Musculoskeletal: Normal range of motion.        General: No tenderness.  Skin:    General: Skin is warm and dry.     Findings: No erythema or rash.  Neurological:     Mental Status: He is alert and oriented to person, place, and time.     Cranial Nerves: No cranial nerve deficit.     Deep Tendon Reflexes: Reflexes are normal and symmetric.  Psychiatric:        Behavior: Behavior normal.        Thought Content: Thought content normal.        Judgment: Judgment normal.    Diabetic Foot Exam - Simple   Simple Foot Form Diabetic Foot exam was performed with the following findings: Yes 04/28/2019  3:28 PM  Visual Inspection No deformities, no ulcerations, no other skin breakdown bilaterally: Yes Sensation Testing Intact to touch and monofilament testing bilaterally: Yes Pulse Check Posterior Tibialis and Dorsalis pulse intact bilaterally: Yes Comments      BP 98/61   Pulse 87   Temp (!) 96.5 F (35.8 C) (Temporal)   Ht _0  (1.854 m)   Wt 163 lb 3.2 oz (74 kg)   SpO2 100%   BMI 21.53 kg/m      Assessment & Plan:  [name] comes in today with chief complaint of Medical Management of Chronic Issues   Diagnosis and orders addressed:  1. Type 1 diabetes mellitus with hyperglycemia (HCC) - Bayer DCA Hb A1c Waived - CMP14+EGFR - CBC with Differential/Platelet - Continuous Blood Gluc Sensor (FREESTYLE LIBRE 14 DAY SENSOR) MISC; Inject 1 each into the skin every 14 (fourteen) days.  Dispense: 6 each; Refill: 3  2. Acquired hypothyroidism - CMP14+EGFR - CBC with Differential/Platelet - TSH  3. 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 Vitamin D deficiency - CMP14+EGFR - CBC with Differential/Platelet - VITAMIN D 25 Hydroxy (Vit-D Deficiency, Fractures)  5. Annual physical exam - Bayer DCA Hb A1c Waived - CMP14+EGFR - CBC with Differential/Platelet - TSH  - Lipid panel - PSA, total and free - VITAMIN D 25 Hydroxy (Vit-D Deficiency, Fractures)   Labs pending Health Maintenance reviewed- Pt has diabetic eye exam scheduled next week, will send [place] a copy Diet and exercise encouraged  Follow up plan: 3 months    Evelina Dun, FNP

## 2019-04-29 ENCOUNTER — Other Ambulatory Visit: Payer: Self-pay | Admitting: Family

## 2019-04-29 LAB — CMP14+EGFR
ALT: 17 IU/L (ref 0–44)
AST: 25 IU/L (ref 0–40)
Albumin/Globulin Ratio: 1.9 (ref 1.2–2.2)
Albumin: 4.5 g/dL (ref 4.0–5.0)
Alkaline Phosphatase: 93 IU/L (ref 39–117)
BUN/Creatinine Ratio: 17 (ref 9–20)
BUN: 18 mg/dL (ref 6–24)
Bilirubin Total: 0.5 mg/dL (ref 0.0–1.2)
CO2: 26 mmol/L (ref 20–29)
Calcium: 9.5 mg/dL (ref 8.7–10.2)
Chloride: 97 mmol/L (ref 96–106)
Creatinine, Ser: 1.06 mg/dL (ref 0.76–1.27)
GFR calc Af Amer: 96 mL/min/{1.73_m2} (ref >59)
GFR calc non Af Amer: 83 mL/min/{1.73_m2} (ref >59)
Globulin, Total: 2.4 g/dL (ref 1.5–4.5)
Glucose: 133 mg/dL — ABNORMAL HIGH (ref 65–99)
Potassium: 4.6 mmol/L (ref 3.5–5.2)
Sodium: 135 mmol/L (ref 134–144)
Total Protein: 6.9 g/dL (ref 6.0–8.5)

## 2019-04-29 LAB — CBC WITH DIFFERENTIAL/PLATELET
Basophils Absolute: 0 10*3/uL (ref 0.0–0.2)
Basos: 0 %
EOS (ABSOLUTE): 0.2 10*3/uL (ref 0.0–0.4)
Eos: 3 %
Hematocrit: 41.9 % (ref 37.5–51.0)
Hemoglobin: 14.1 g/dL (ref 13.0–17.7)
Immature Grans (Abs): 0 10*3/uL (ref 0.0–0.1)
Immature Granulocytes: 0 %
Lymphocytes Absolute: 2.9 10*3/uL (ref 0.7–3.1)
Lymphs: 45 %
MCH: 33.4 pg — ABNORMAL HIGH (ref 26.6–33.0)
MCHC: 33.7 g/dL (ref 31.5–35.7)
MCV: 99 fL — ABNORMAL HIGH (ref 79–97)
Monocytes Absolute: 0.5 10*3/uL (ref 0.1–0.9)
Monocytes: 8 %
Neutrophils Absolute: 2.9 10*3/uL (ref 1.4–7.0)
Neutrophils: 44 %
Platelets: 233 10*3/uL (ref 150–450)
RBC: 4.22 x10E6/uL (ref 4.14–5.80)
RDW: 14.5 % (ref 11.6–15.4)
WBC: 6.5 10*3/uL (ref 3.4–10.8)

## 2019-04-29 LAB — PSA, TOTAL AND FREE
PSA, Free Pct: 67.5 %
PSA, Free: 0.27 ng/mL
Prostate Specific Ag, Serum: 0.4 ng/mL (ref 0.0–4.0)

## 2019-04-29 LAB — LIPID PANEL
Chol/HDL Ratio: 3.5 ratio (ref 0.0–5.0)
Cholesterol, Total: 122 mg/dL (ref 100–199)
HDL: 35 mg/dL — ABNORMAL LOW (ref >39)
LDL Chol Calc (NIH): 53 mg/dL (ref 0–99)
Triglycerides: 210 mg/dL — ABNORMAL HIGH (ref 0–149)
VLDL Cholesterol Cal: 34 mg/dL (ref 5–40)

## 2019-04-29 LAB — TSH: TSH: 12.1 u[IU]/mL — ABNORMAL HIGH (ref 0.450–4.500)

## 2019-04-29 LAB — VITAMIN D 25 HYDROXY (VIT D DEFICIENCY, FRACTURES): Vit D, 25-Hydroxy: 61.2 ng/mL (ref 30.0–100.0)

## 2019-04-29 MED ORDER — LEVOTHYROXINE SODIUM 125 MCG PO TABS: 125.0000 ug | ORAL_TABLET | Freq: Every day | ORAL | 11 refills | Status: DC

## 2019-05-04 ENCOUNTER — Encounter: Payer: Self-pay | Admitting: Family

## 2019-05-19 ENCOUNTER — Other Ambulatory Visit: Payer: Self-pay | Admitting: Family

## 2019-05-19 DIAGNOSIS — E559 Vitamin D deficiency, unspecified: Secondary | ICD-10-CM

## 2019-07-14 ENCOUNTER — Other Ambulatory Visit: Payer: Self-pay | Admitting: Family

## 2019-07-14 DIAGNOSIS — E1065 Type 1 diabetes mellitus with hyperglycemia: Secondary | ICD-10-CM

## 2019-07-27 ENCOUNTER — Other Ambulatory Visit: Payer: Self-pay

## 2019-07-27 ENCOUNTER — Ambulatory Visit: Payer: BC Managed Care – PPO | Attending: Internal Medicine

## 2019-07-27 DIAGNOSIS — Z20828 Contact with and (suspected) exposure to other viral communicable diseases: Secondary | ICD-10-CM | POA: Diagnosis not present

## 2019-07-27 DIAGNOSIS — Z20822 Contact with and (suspected) exposure to covid-19: Secondary | ICD-10-CM

## 2019-07-28 LAB — NOVEL CORONAVIRUS, NAA: SARS-CoV-2, NAA: DETECTED — AB

## 2019-08-02 ENCOUNTER — Ambulatory Visit (INDEPENDENT_AMBULATORY_CARE_PROVIDER_SITE_OTHER): Payer: BC Managed Care – PPO | Admitting: Family

## 2019-08-02 ENCOUNTER — Telehealth: Payer: Self-pay | Admitting: Family

## 2019-08-02 ENCOUNTER — Encounter: Payer: Self-pay | Admitting: Family

## 2019-08-02 NOTE — Progress Notes (Signed)
   Virtual Visit via telephone Note Due to COVID-19 pandemic this visit was conducted virtually. This visit type was conducted due to national recommendations for restrictions regarding the COVID-19 Pandemic (e.g. social distancing, sheltering in place) in an effort to limit this patient's exposure and mitigate transmission in our community. All issues noted in this document were discussed and addressed.  A physical exam was not performed with this format.  Attempted to call patient at 8:18 AM & 8:45 AM. No answer and VM left.   Jannifer Rodney, FNP

## 2019-08-11 ENCOUNTER — Other Ambulatory Visit: Payer: Self-pay | Admitting: Family

## 2019-08-11 DIAGNOSIS — E782 Mixed hyperlipidemia: Secondary | ICD-10-CM

## 2019-08-11 DIAGNOSIS — E559 Vitamin D deficiency, unspecified: Secondary | ICD-10-CM

## 2019-08-18 ENCOUNTER — Other Ambulatory Visit: Payer: Self-pay | Admitting: Family

## 2019-08-18 DIAGNOSIS — E559 Vitamin D deficiency, unspecified: Secondary | ICD-10-CM

## 2019-11-07 ENCOUNTER — Other Ambulatory Visit: Payer: Self-pay | Admitting: Family

## 2019-11-08 ENCOUNTER — Other Ambulatory Visit: Payer: Self-pay | Admitting: Family

## 2019-11-08 DIAGNOSIS — E782 Mixed hyperlipidemia: Secondary | ICD-10-CM

## 2019-11-22 ENCOUNTER — Ambulatory Visit: Payer: Self-pay | Admitting: Family

## 2019-11-23 ENCOUNTER — Encounter: Payer: Self-pay | Admitting: Family

## 2020-02-17 ENCOUNTER — Ambulatory Visit: Payer: 59 | Attending: Internal Medicine

## 2020-02-17 DIAGNOSIS — Z20822 Contact with and (suspected) exposure to covid-19: Secondary | ICD-10-CM

## 2020-02-18 LAB — SARS-COV-2, NAA 2 DAY TAT

## 2020-02-18 LAB — NOVEL CORONAVIRUS, NAA: SARS-CoV-2, NAA: NOT DETECTED

## 2020-02-20 ENCOUNTER — Other Ambulatory Visit: Payer: BC Managed Care – PPO

## 2020-03-12 ENCOUNTER — Other Ambulatory Visit: Payer: Self-pay | Admitting: Family

## 2020-03-12 DIAGNOSIS — E559 Vitamin D deficiency, unspecified: Secondary | ICD-10-CM

## 2020-03-16 ENCOUNTER — Ambulatory Visit: Payer: Self-pay | Admitting: Family

## 2020-03-16 ENCOUNTER — Telehealth: Payer: Self-pay | Admitting: Family

## 2020-03-16 DIAGNOSIS — E559 Vitamin D deficiency, unspecified: Secondary | ICD-10-CM

## 2020-03-16 MED ORDER — VITAMIN D (ERGOCALCIFEROL) 1.25 MG (50000 UNIT) PO CAPS: 50000.0000 [IU] | ORAL_CAPSULE | ORAL | 0 refills | Status: DC

## 2020-03-16 MED ORDER — LEVOTHYROXINE SODIUM 125 MCG PO TABS: 125.0000 ug | ORAL_TABLET | Freq: Every day | ORAL | 0 refills | Status: DC

## 2020-03-16 NOTE — Telephone Encounter (Signed)
Sent 30 day supply to [facility] per patients request. Patient notified and verbalized understanding

## 2020-03-16 NOTE — Telephone Encounter (Signed)
  Prescription Request  03/16/2020  What is the name of the medication or equipment? Vitamin D, Ergocalciferol, (DRISDOL) 1.25 MG and Euthxrox. Pt made first available appt with christy in Sept. Pt wants to know if we can call in enough to last until appointment  Have you contacted your pharmacy to request a refill? (if applicable) yes  Which pharmacy would you like this sent to? [facility]   Patient notified that their request is being sent to the clinical staff for review and that they should receive a response within 2 business days.

## 2020-04-23 ENCOUNTER — Other Ambulatory Visit: Payer: Self-pay

## 2020-04-23 ENCOUNTER — Ambulatory Visit: Payer: 59 | Admitting: Family

## 2020-04-23 ENCOUNTER — Encounter: Payer: Self-pay | Admitting: Family

## 2020-04-23 VITALS — BP 101/67 | HR 83 | Temp 97.9°F | Ht 73.0 in | Wt 165.4 lb

## 2020-04-23 DIAGNOSIS — E559 Vitamin D deficiency, unspecified: Secondary | ICD-10-CM

## 2020-04-23 DIAGNOSIS — E1065 Type 1 diabetes mellitus with hyperglycemia: Secondary | ICD-10-CM

## 2020-04-23 DIAGNOSIS — E039 Hypothyroidism, unspecified: Secondary | ICD-10-CM

## 2020-04-23 DIAGNOSIS — Z0001 Encounter for general adult medical examination with abnormal findings: Secondary | ICD-10-CM

## 2020-04-23 DIAGNOSIS — E782 Mixed hyperlipidemia: Secondary | ICD-10-CM

## 2020-04-23 DIAGNOSIS — E1069 Type 1 diabetes mellitus with other specified complication: Secondary | ICD-10-CM

## 2020-04-23 DIAGNOSIS — K219 Gastro-esophageal reflux disease without esophagitis: Secondary | ICD-10-CM

## 2020-04-23 DIAGNOSIS — Z114 Encounter for screening for human immunodeficiency virus [HIV]: Secondary | ICD-10-CM

## 2020-04-23 DIAGNOSIS — Z0000 Encounter for general adult medical examination without abnormal findings: Secondary | ICD-10-CM

## 2020-04-23 DIAGNOSIS — N400 Enlarged prostate without lower urinary tract symptoms: Secondary | ICD-10-CM

## 2020-04-23 DIAGNOSIS — Z1159 Encounter for screening for other viral diseases: Secondary | ICD-10-CM

## 2020-04-23 LAB — BAYER DCA HB A1C WAIVED: HB A1C (BAYER DCA - WAIVED): 8 % — ABNORMAL HIGH (ref <7.0)

## 2020-04-23 MED ORDER — TAMSULOSIN HCL 0.4 MG PO CAPS: 0.4000 mg | ORAL_CAPSULE | Freq: Every day | ORAL | 3 refills | Status: DC

## 2020-04-23 MED ORDER — ROSUVASTATIN CALCIUM 20 MG PO TABS: 20.0000 mg | ORAL_TABLET | Freq: Every day | ORAL | 0 refills | Status: DC

## 2020-04-23 MED ORDER — OMEPRAZOLE 20 MG PO CPDR: 20.0000 mg | DELAYED_RELEASE_CAPSULE | Freq: Every day | ORAL | 3 refills | Status: DC

## 2020-04-23 MED ORDER — LEVOTHYROXINE SODIUM 125 MCG PO TABS: 125.0000 ug | ORAL_TABLET | Freq: Every day | ORAL | 3 refills | Status: DC

## 2020-04-23 MED ORDER — TRESIBA FLEXTOUCH 100 UNIT/ML ~~LOC~~ SOPN: 12.0000 [IU] | PEN_INJECTOR | Freq: Every day | SUBCUTANEOUS | 0 refills | Status: DC

## 2020-04-23 MED ORDER — NOVOLOG FLEXPEN 100 UNIT/ML ~~LOC~~ SOPN: PEN_INJECTOR | SUBCUTANEOUS | 2 refills | Status: DC

## 2020-04-23 MED ORDER — INSULIN NPH ISOPHANE & REGULAR (70-30) 100 UNIT/ML ~~LOC~~ SUSP: 15.0000 [IU] | Freq: Two times a day (BID) | SUBCUTANEOUS | 6 refills | Status: DC

## 2020-04-23 NOTE — Patient Instructions (Addendum)
Gastroesophageal Reflux Disease, Adult Gastroesophageal reflux (GER) happens when acid from the stomach flows up into the tube that connects the mouth and the stomach (esophagus). Normally, food travels down the esophagus and stays in the stomach to be digested. However, when a person has GER, food and stomach acid sometimes move back up into the esophagus. If this becomes a more serious problem, the person may be diagnosed with a disease called gastroesophageal reflux disease (GERD). GERD occurs when the reflux:  Happens often.  Causes frequent or severe symptoms.  Causes problems such as damage to the esophagus. When stomach acid comes in contact with the esophagus, the acid may cause soreness (inflammation) in the esophagus. Over time, GERD may create small holes (ulcers) in the lining of the esophagus. What are the causes? This condition is caused by a problem with the muscle between the esophagus and the stomach (lower esophageal sphincter, or LES). Normally, the LES muscle closes after food passes through the esophagus to the stomach. When the LES is weakened or abnormal, it does not close properly, and that allows food and stomach acid to go back up into the esophagus. The LES can be weakened by certain dietary substances, medicines, and medical conditions, including:  Tobacco use.  Pregnancy.  Having a hiatal hernia.  Alcohol use.  Certain foods and beverages, such as coffee, chocolate, onions, and peppermint. What increases the risk? You are more likely to develop this condition if you:  Have an increased body weight.  Have a connective tissue disorder.  Use NSAID medicines. What are the signs or symptoms? Symptoms of this condition include:  Heartburn.  Difficult or painful swallowing.  The feeling of having a lump in the throat.  Abitter taste in the mouth.  Bad breath.  Having a large amount of saliva.  Having an upset or bloated  stomach.  Belching.  Chest pain. Different conditions can cause chest pain. Make sure you see your health care provider if you experience chest pain.  Shortness of breath or wheezing.  Ongoing (chronic) cough or a night-time cough.  Wearing away of tooth enamel.  Weight loss. How is this diagnosed? Your health care provider will take a medical history and perform a physical exam. To determine if you have mild or severe GERD, your health care provider may also monitor how you respond to treatment. You may also have tests, including:  A test to examine your stomach and esophagus with a small camera (endoscopy).  A test thatmeasures the acidity level in your esophagus.  A test thatmeasures how much pressure is on your esophagus.  A barium swallow or modified barium swallow test to show the shape, size, and functioning of your esophagus. How is this treated? The goal of treatment is to help relieve your symptoms and to prevent complications. Treatment for this condition may vary depending on how severe your symptoms are. Your health care provider may recommend:  Changes to your diet.  Medicine.  Surgery. Follow these instructions at home: Eating and drinking   Follow a diet as recommended by your health care provider. This may involve avoiding foods and drinks such as: ? Coffee and tea (with or without caffeine). ? Drinks that containalcohol. ? Energy drinks and sports drinks. ? Carbonated drinks or sodas. ? Chocolate and cocoa. ? Peppermint and mint flavorings. ? Garlic and onions. ? Horseradish. ? Spicy and acidic foods, including peppers, chili powder, curry powder, vinegar, hot sauces, and barbecue sauce. ? Citrus fruit juices and citrus   fruits, such as oranges, lemons, and limes. ? Tomato-based foods, such as red sauce, chili, salsa, and pizza with red sauce. ? Fried and fatty foods, such as donuts, french fries, potato chips, and high-fat dressings. ? High-fat  meats, such as hot dogs and fatty cuts of red and white meats, such as rib eye steak, sausage, ham, and bacon. ? High-fat dairy items, such as whole milk, butter, and cream cheese.  Eat small, frequent meals instead of large meals.  Avoid drinking large amounts of liquid with your meals.  Avoid eating meals during the 2-3 hours before bedtime.  Avoid lying down right after you eat.  Do not exercise right after you eat. Lifestyle   Do not use any products that contain nicotine or tobacco, such as cigarettes, e-cigarettes, and chewing tobacco. If you need help quitting, ask your health care provider.  Try to reduce your stress by using methods such as yoga or meditation. If you need help reducing stress, ask your health care provider.  If you are overweight, reduce your weight to an amount that is healthy for you. Ask your health care provider for guidance about a safe weight loss goal. General instructions  Pay attention to any changes in your symptoms.  Take over-the-counter and prescription medicines only as told by your health care provider. Do not take aspirin, ibuprofen, or other NSAIDs unless your health care provider told you to do so.  Wear loose-fitting clothing. Do not wear anything tight around your waist that causes pressure on your abdomen.  Raise (elevate) the head of your bed about 6 inches (15 cm).  Avoid bending over if this makes your symptoms worse.  Keep all follow-up visits as told by your health care provider. This is important. Contact a health care provider if:  You have: ? New symptoms. ? Unexplained weight loss. ? Difficulty swallowing or it hurts to swallow. ? Wheezing or a persistent cough. ? A hoarse voice.  Your symptoms do not improve with treatment. Get help right away if you:  Have pain in your arms, neck, jaw, teeth, or back.  Feel sweaty, dizzy, or light-headed.  Have chest pain or shortness of breath.  Vomit and your vomit looks  like blood or coffee grounds.  Faint.  Have stool that is bloody or black.  Cannot swallow, drink, or eat. Summary  Gastroesophageal reflux happens when acid from the stomach flows up into the esophagus. GERD is a disease in which the reflux happens often, causes frequent or severe symptoms, or causes problems such as damage to the esophagus.  Treatment for this condition may vary depending on how severe your symptoms are. Your health care provider may recommend diet and lifestyle changes, medicine, or surgery.  Contact a health care provider if you have new or worsening symptoms.  Take over-the-counter and prescription medicines only as told by your health care provider. Do not take aspirin, ibuprofen, or other NSAIDs unless your health care provider told you to do so.  Keep all follow-up visits as told by your health care provider. This is important. This information is not intended to replace advice given to you by your health care provider. Make sure you discuss any questions you have with your health care provider. Document Revised: 01/20/2018 Document Reviewed: 01/20/2018 Elsevier Patient Education  2020 [employer] Benign Prostatic Hyperplasia  Benign prostatic hyperplasia (BPH) is an enlarged prostate gland that is caused by the normal aging process and not by cancer. The prostate is a walnut-sized  gland that is involved in the production of semen. It is located in front of the rectum and below the bladder. The bladder stores urine and the urethra is the tube that carries the urine out of the body. The prostate may get bigger as a man gets older. An enlarged prostate can press on the urethra. This can make it harder to pass urine. The build-up of urine in the bladder can cause infection. Back pressure and infection may progress to bladder damage and kidney (renal) failure. What are the causes? This condition is part of a normal aging process. However, not all men develop problems  from this condition. If the prostate enlarges away from the urethra, urine flow will not be blocked. If it enlarges toward the urethra and compresses it, there will be problems passing urine. What increases the risk? This condition is more likely to develop in men over the age of 50 years. What are the signs or symptoms? Symptoms of this condition include:  Getting up often during the night to urinate.  Needing to urinate frequently during the day.  Difficulty starting urine flow.  Decrease in size and strength of your urine stream.  Leaking (dribbling) after urinating.  Inability to pass urine. This needs immediate treatment.  Inability to completely empty your bladder.  Pain when you pass urine. This is more common if there is also an infection.  Urinary tract infection (UTI). How is this diagnosed? This condition is diagnosed based on your medical history, a physical exam, and your symptoms. Tests will also be done, such as:  A post-void bladder scan. This measures any amount of urine that may remain in your bladder after you finish urinating.  A digital rectal exam. In a rectal exam, your health care provider checks your prostate by putting a lubricated, gloved finger into your rectum to feel the back of your prostate gland. This exam detects the size of your gland and any abnormal lumps or growths.  An exam of your urine (urinalysis).  A prostate specific antigen (PSA) screening. This is a blood test used to screen for prostate cancer.  An ultrasound. This test uses sound waves to electronically produce a picture of your prostate gland. Your health care provider may refer you to a specialist in kidney and prostate diseases (urologist). How is this treated? Once symptoms begin, your health care provider will monitor your condition (active surveillance or watchful waiting). Treatment for this condition will depend on the severity of your condition. Treatment may  include:  Observation and yearly exams. This may be the only treatment needed if your condition and symptoms are mild.  Medicines to relieve your symptoms, including: ? Medicines to shrink the prostate. ? Medicines to relax the muscle of the prostate.  Surgery in severe cases. Surgery may include: ? Prostatectomy. In this procedure, the prostate tissue is removed completely through an open incision or with a laparoscope or robotics. ? Transurethral resection of the prostate (TURP). In this procedure, a tool is inserted through the opening at the tip of the penis (urethra). It is used to cut away tissue of the inner core of the prostate. The pieces are removed through the same opening of the penis. This removes the blockage. ? Transurethral incision (TUIP). In this procedure, small cuts are made in the prostate. This lessens the prostate's pressure on the urethra. ? Transurethral microwave thermotherapy (TUMT). This procedure uses microwaves to create heat. The heat destroys and removes a small amount of prostate tissue. ?  Transurethral needle ablation (TUNA). This procedure uses radio frequencies to destroy and remove a small amount of prostate tissue. ? Interstitial laser coagulation (ILC). This procedure uses a laser to destroy and remove a small amount of prostate tissue. ? Transurethral electrovaporization (TUVP). This procedure uses electrodes to destroy and remove a small amount of prostate tissue. ? Prostatic urethral lift. This procedure inserts an implant to push the lobes of the prostate away from the urethra. Follow these instructions at home:  Take over-the-counter and prescription medicines only as told by your health care provider.  Monitor your symptoms for any changes. Contact your health care provider with any changes.  Avoid drinking large amounts of liquid before going to bed or out in public.  Avoid or reduce how much caffeine or alcohol you drink.  Give yourself time  when you urinate.  Keep all follow-up visits as told by your health care provider. This is important. Contact a health care provider if:  You have unexplained back pain.  Your symptoms do not get better with treatment.  You develop side effects from the medicine you are taking.  Your urine becomes very dark or has a bad smell.  Your lower abdomen becomes distended and you have trouble passing your urine. Get help right away if:  You have a fever or chills.  You suddenly cannot urinate.  You feel lightheaded, or very dizzy, or you faint.  There are large amounts of blood or clots in the urine.  Your urinary problems become hard to manage.  You develop moderate to severe low back or flank pain. The flank is the side of your body between the ribs and the hip. These symptoms may represent a serious problem that is an emergency. Do not wait to see if the symptoms will go away. Get medical help right away. Call your local emergency services (911 in the [place] Do not drive yourself to the hospital. Summary  Benign prostatic hyperplasia (BPH) is an enlarged prostate that is caused by the normal aging process and not by cancer.  An enlarged prostate can press on the urethra. This can make it hard to pass urine.  This condition is part of a normal aging process and is more likely to develop in men over the age of 50 years.  Get help right away if you suddenly cannot urinate. This information is not intended to replace advice given to you by your health care provider. Make sure you discuss any questions you have with your health care provider. Document Revised: 06/08/2018 Document Reviewed: 08/18/2016 Elsevier Patient Education  2020 [employer].

## 2020-04-23 NOTE — Progress Notes (Signed)
Subjective:    Patient ID: [name], male    DOB: 19-Oct-1970, 49 y.o.   MRN: [id number]  Chief Complaint  Patient presents with   Medical Management of Chronic Issues   Diabetes   Pt presents to the office today for CPE.  Diabetes He presents for his follow-up diabetic visit. He has type 1 diabetes mellitus. There are no hypoglycemic associated symptoms. Pertinent negatives for hypoglycemia include no nervousness/anxiousness. Pertinent negatives for diabetes include no blurred vision and no foot paresthesias. Symptoms are stable. Pertinent negatives for diabetic complications include no CVA, heart disease or peripheral neuropathy. Risk factors for coronary artery disease include male sex, sedentary lifestyle, dyslipidemia and diabetes mellitus. He is following a generally healthy diet. His overall blood glucose range is 130-140 mg/dl. Eye exam is not current.  Hyperlipidemia This is a chronic problem. The current episode started more than 1 year ago. The problem is controlled. Recent lipid tests were reviewed and are normal. Exacerbating diseases include hypothyroidism. The current treatment provides moderate improvement of lipids. Risk factors for coronary artery disease include male sex, hypertension, a sedentary lifestyle, dyslipidemia and diabetes mellitus.  Thyroid Problem Presents for follow-up visit. Patient reports no anxiety, constipation or diarrhea. The symptoms have been stable. His past medical history is significant for hyperlipidemia.  Gastroesophageal Reflux He complains of belching and heartburn. This is a chronic problem. The current episode started more than 1 year ago. The problem occurs occasionally. He has tried an antacid for the symptoms. The treatment provided mild relief.      Review of Systems  Eyes: Negative for blurred vision.  Gastrointestinal: Positive for heartburn. Negative for constipation and diarrhea.  Psychiatric/Behavioral: The patient is not  nervous/anxious.   All other systems reviewed and are negative.  Family History  Problem Relation Age of Onset   Muscular dystrophy Mother    Diabetes Father    Social History   Socioeconomic History   Marital status: Married    Spouse name: Not on file   Number of children: Not on file   Years of education: Not on file   Highest education level: Not on file  Occupational History   Not on file  Tobacco Use   Smoking status: Never Smoker   Smokeless tobacco: Never Used  Vaping Use   Vaping Use: Never used  Substance and Sexual Activity   Alcohol use: No   Drug use: No   Sexual activity: Yes  Other Topics Concern   Not on file  Social History Narrative   Not on file   Social Determinants of Health   Financial Resource Strain:    Difficulty of Paying Living Expenses: Not on file  Food Insecurity:    Worried About [place] in the Last Year: Not on file   Ran Out of Food in the Last Year: Not on file  Transportation Needs:    Lack of Transportation (Medical): Not on file   Lack of Transportation (Non-Medical): Not on file  Physical Activity:    Days of Exercise per Week: Not on file   Minutes of Exercise per Session: Not on file  Stress:    Feeling of Stress : Not on file  Social Connections:    Frequency of Communication with Friends and Family: Not on file   Frequency of Social Gatherings with Friends and Family: Not on file   Attends Religious Services: Not on file   Active Member of Clubs or Organizations: Not on file  Attends Archivist Meetings: Not on file   Marital Status: Not on file       Objective:   Physical Exam Vitals reviewed.  Constitutional:      General: He is not in acute distress.    Appearance: He is well-developed.  HENT:     Head: Normocephalic.     Right Ear: Tympanic membrane normal.     Left Ear: Tympanic membrane normal.  Eyes:     General:        Right eye: No discharge.          Left eye: No discharge.     Pupils: Pupils are equal, round, and reactive to light.  Neck:     Thyroid: No thyromegaly.  Cardiovascular:     Rate and Rhythm: Normal rate and regular rhythm.     Heart sounds: Normal heart sounds. No murmur heard.   Pulmonary:     Effort: Pulmonary effort is normal. No respiratory distress.     Breath sounds: Normal breath sounds. No wheezing.  Abdominal:     General: Bowel sounds are normal. There is no distension.     Palpations: Abdomen is soft.     Tenderness: There is no abdominal tenderness.  Musculoskeletal:        General: No tenderness. Normal range of motion.     Cervical back: Normal range of motion and neck supple.  Skin:    General: Skin is warm and dry.     Findings: No erythema or rash.  Neurological:     Mental Status: He is alert and oriented to person, place, and time.     Cranial Nerves: No cranial nerve deficit.     Deep Tendon Reflexes: Reflexes are normal and symmetric.  Psychiatric:        Behavior: Behavior normal.        Thought Content: Thought content normal.        Judgment: Judgment normal.       BP 101/67    Pulse 83    Temp 97.9 F (36.6 C) (Temporal)    Ht '6\' 1"'  (1.854 m)    Wt 165 lb 6.4 oz (75 kg)    BMI 21.82 kg/m      Assessment & Plan:  [name] comes in today with chief complaint of Medical Management of Chronic Issues and Diabetes   Diagnosis and orders addressed:  1. Type 1 diabetes mellitus with hyperglycemia (HCC) Will increase Tresiba to 12 units from 10 units Will increase 70-30 to 15 units BID  Low carb diet - Bayer DCA Hb A1c Waived - insulin NPH-regular Human (70-30) 100 UNIT/ML injection; Inject 15 Units into the skin 2 (two) times daily with a meal.  Dispense: 10 mL; Refill: 6 - insulin aspart (NOVOLOG FLEXPEN) 100 UNIT/ML FlexPen; INJECT AS NEEDED FOR ELEVATED BG, OVER 200 - GIVE: 2 UNITS; 251-300- 3 UNITS; 301 OR OVER - 4 UNITS (THIS IS A 75 DAY SUPPLY)  Dispense: 15 mL;  Refill: 2 - CMP14+EGFR - CBC with Differential/Platelet - insulin degludec (TRESIBA FLEXTOUCH) 100 UNIT/ML FlexTouch Pen; Inject 12 Units into the skin at bedtime.  Dispense: 15 mL; Refill: 0  2. Elevated cholesterol with elevated triglycerides - rosuvastatin (CRESTOR) 20 MG tablet; Take 1 tablet (20 mg total) by mouth daily.  Dispense: 90 tablet; Refill: 0 - CMP14+EGFR - CBC with Differential/Platelet  3. Acquired hypothyroidism - CMP14+EGFR - CBC with Differential/Platelet - TSH  4. 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 Vitamin D deficiency - CMP14+EGFR - CBC with Differential/Platelet - VITAMIN D 25 Hydroxy (Vit-D Deficiency, Fractures)  6. Annual physical exam - CMP14+EGFR - CBC with Differential/Platelet - Lipid panel - TSH - PSA, total and free - Hepatitis C antibody - HIV Antibody (routine testing w rflx)  7. Need for hepatitis C screening test - CMP14+EGFR - CBC with Differential/Platelet - Hepatitis C antibody  8. Encounter for screening for HIV - CMP14+EGFR - CBC with Differential/Platelet - HIV Antibody (routine testing w rflx)  9. Gastroesophageal reflux disease, unspecified whether esophagitis present --Diet discussed- Avoid fried, spicy, citrus foods, caffeine and alcohol -Do not eat 2-3 hours before bedtime -Encouraged small frequent meals -Avoid NSAID's - CMP14+EGFR - CBC with Differential/Platelet - omeprazole (PRILOSEC) 20 MG capsule; Take 1 capsule (20 mg total) by mouth daily.  Dispense: 30 capsule; Refill: 3  10. Benign prostatic hyperplasia, unspecified whether lower urinary tract symptoms present -Will start Flomax today - CMP14+EGFR - CBC with Differential/Platelet - tamsulosin (FLOMAX) 0.4 MG CAPS capsule; Take 1 capsule (0.4 mg total) by mouth daily.  Dispense: 30 capsule; Refill: 3   Labs pending Health Maintenance  reviewed Diet and exercise encouraged  Follow up plan: 3 months    Evelina Dun, FNP

## 2020-04-24 ENCOUNTER — Other Ambulatory Visit: Payer: Self-pay | Admitting: Family

## 2020-04-24 LAB — PSA, TOTAL AND FREE
PSA, Free Pct: 56 %
PSA, Free: 0.28 ng/mL
Prostate Specific Ag, Serum: 0.5 ng/mL (ref 0.0–4.0)

## 2020-04-24 LAB — URINE CULTURE: Organism ID, Bacteria: NO GROWTH

## 2020-04-24 LAB — CBC WITH DIFFERENTIAL/PLATELET
Basophils Absolute: 0 10*3/uL (ref 0.0–0.2)
Basos: 1 %
EOS (ABSOLUTE): 0.2 10*3/uL (ref 0.0–0.4)
Eos: 3 %
Hematocrit: 38.7 % (ref 37.5–51.0)
Hemoglobin: 13.4 g/dL (ref 13.0–17.7)
Immature Grans (Abs): 0 10*3/uL (ref 0.0–0.1)
Immature Granulocytes: 0 %
Lymphocytes Absolute: 2.9 10*3/uL (ref 0.7–3.1)
Lymphs: 48 %
MCH: 37.1 pg — ABNORMAL HIGH (ref 26.6–33.0)
MCHC: 34.6 g/dL (ref 31.5–35.7)
MCV: 107 fL — ABNORMAL HIGH (ref 79–97)
Monocytes Absolute: 0.4 10*3/uL (ref 0.1–0.9)
Monocytes: 7 %
Neutrophils Absolute: 2.4 10*3/uL (ref 1.4–7.0)
Neutrophils: 41 %
Platelets: 233 10*3/uL (ref 150–450)
RBC: 3.61 x10E6/uL — ABNORMAL LOW (ref 4.14–5.80)
RDW: 15.8 % — ABNORMAL HIGH (ref 11.6–15.4)
WBC: 6 10*3/uL (ref 3.4–10.8)

## 2020-04-24 LAB — CMP14+EGFR
ALT: 21 IU/L (ref 0–44)
AST: 26 IU/L (ref 0–40)
Albumin/Globulin Ratio: 1.7 (ref 1.2–2.2)
Albumin: 4.2 g/dL (ref 4.0–5.0)
Alkaline Phosphatase: 107 IU/L (ref 44–121)
BUN/Creatinine Ratio: 17 (ref 9–20)
BUN: 18 mg/dL (ref 6–24)
Bilirubin Total: 0.7 mg/dL (ref 0.0–1.2)
CO2: 23 mmol/L (ref 20–29)
Calcium: 9.3 mg/dL (ref 8.7–10.2)
Chloride: 98 mmol/L (ref 96–106)
Creatinine, Ser: 1.08 mg/dL (ref 0.76–1.27)
GFR calc Af Amer: 93 mL/min/{1.73_m2} (ref >59)
GFR calc non Af Amer: 81 mL/min/{1.73_m2} (ref >59)
Globulin, Total: 2.5 g/dL (ref 1.5–4.5)
Glucose: 304 mg/dL — ABNORMAL HIGH (ref 65–99)
Potassium: 4.3 mmol/L (ref 3.5–5.2)
Sodium: 134 mmol/L (ref 134–144)
Total Protein: 6.7 g/dL (ref 6.0–8.5)

## 2020-04-24 LAB — VITAMIN D 25 HYDROXY (VIT D DEFICIENCY, FRACTURES): Vit D, 25-Hydroxy: 60.2 ng/mL (ref 30.0–100.0)

## 2020-04-24 LAB — LIPID PANEL
Chol/HDL Ratio: 3.8 ratio (ref 0.0–5.0)
Cholesterol, Total: 118 mg/dL (ref 100–199)
HDL: 31 mg/dL — ABNORMAL LOW (ref >39)
LDL Chol Calc (NIH): 44 mg/dL (ref 0–99)
Triglycerides: 276 mg/dL — ABNORMAL HIGH (ref 0–149)
VLDL Cholesterol Cal: 43 mg/dL — ABNORMAL HIGH (ref 5–40)

## 2020-04-24 LAB — HIV ANTIBODY (ROUTINE TESTING W REFLEX): HIV Screen 4th Generation wRfx: NONREACTIVE

## 2020-04-24 LAB — TSH: TSH: 6.45 u[IU]/mL — ABNORMAL HIGH (ref 0.450–4.500)

## 2020-04-24 MED ORDER — LEVOTHYROXINE SODIUM 150 MCG PO TABS: 150.0000 ug | ORAL_TABLET | Freq: Every day | ORAL | 3 refills | Status: DC

## 2020-04-25 LAB — SPECIMEN STATUS REPORT

## 2020-04-25 LAB — HEPATITIS C ANTIBODY: Hep C Virus Ab: <0.1 s/co ratio (ref 0.0–0.9)

## 2020-05-08 LAB — HM DIABETES EYE EXAM

## 2020-06-25 ENCOUNTER — Other Ambulatory Visit: Payer: Self-pay | Admitting: Family

## 2020-06-25 DIAGNOSIS — E559 Vitamin D deficiency, unspecified: Secondary | ICD-10-CM

## 2020-07-02 ENCOUNTER — Other Ambulatory Visit: Payer: Self-pay | Admitting: Family

## 2020-07-02 DIAGNOSIS — E1065 Type 1 diabetes mellitus with hyperglycemia: Secondary | ICD-10-CM

## 2020-07-02 DIAGNOSIS — E559 Vitamin D deficiency, unspecified: Secondary | ICD-10-CM

## 2020-07-02 MED ORDER — FREESTYLE LIBRE READER DEVI: 1.0000 | Freq: Every day | 0 refills | Status: DC

## 2020-07-03 ENCOUNTER — Telehealth: Payer: Self-pay

## 2020-07-03 DIAGNOSIS — E559 Vitamin D deficiency, unspecified: Secondary | ICD-10-CM

## 2020-07-03 DIAGNOSIS — E1065 Type 1 diabetes mellitus with hyperglycemia: Secondary | ICD-10-CM

## 2020-07-03 MED ORDER — FREESTYLE LIBRE READER DEVI: 1.0000 | Freq: Every day | 0 refills | Status: DC

## 2020-07-03 MED ORDER — VITAMIN D (ERGOCALCIFEROL) 1.25 MG (50000 UNIT) PO CAPS: 50000.0000 [IU] | ORAL_CAPSULE | ORAL | 0 refills | Status: DC

## 2020-07-03 NOTE — Telephone Encounter (Signed)
  Prescription Request  07/03/2020  What is the name of the medication or equipment? Libre  Have you contacted your pharmacy to request a refill? (if applicable) yes  Which pharmacy would you like this sent to? [facility]   Patient notified that their request is being sent to the clinical staff for review and that they should receive a response within 2 business days.   Lendon Colonel' pt.  He only got one month supply sent in & he wants to know why only one month.  Please call pt. bc he is a diabetic & he is concerned.

## 2020-07-03 NOTE — Telephone Encounter (Signed)
Refill corrected and sent on freetyle Libre  Pt is wanting refill on his Vit D 50,000u Last lab was done 04/23/20 60.2 Please advise

## 2020-07-03 NOTE — Telephone Encounter (Signed)
Prescription sent to pharmacy.

## 2020-07-04 ENCOUNTER — Other Ambulatory Visit: Payer: Self-pay

## 2020-07-04 MED ORDER — FREESTYLE LIBRE 14 DAY SENSOR MISC: 3 refills | Status: DC

## 2020-07-04 NOTE — Telephone Encounter (Signed)
  Prescription Request  07/04/2020  What is the name of the medication or equipment? Libre sensors and looks like the reader was called in yesterday. Patient hadn't been able to check his blood sugar the last two days. Was told yesterday there was six called in but it looks like it was the reader instead of the sensor  Have you contacted your pharmacy to request a refill? (if applicable) Yes  Which pharmacy would you like this sent to? [place] [facility]   Patient notified that their request is being sent to the clinical staff for review and that they should receive a response within 2 business days.

## 2020-07-04 NOTE — Telephone Encounter (Signed)
Sent sensors in for pt - aware

## 2020-07-31 ENCOUNTER — Ambulatory Visit: Payer: 59 | Admitting: Family

## 2020-08-09 ENCOUNTER — Ambulatory Visit (INDEPENDENT_AMBULATORY_CARE_PROVIDER_SITE_OTHER): Payer: 59 | Admitting: Family

## 2020-08-09 ENCOUNTER — Encounter: Payer: Self-pay | Admitting: Family

## 2020-08-09 ENCOUNTER — Other Ambulatory Visit: Payer: Self-pay

## 2020-08-09 DIAGNOSIS — E1065 Type 1 diabetes mellitus with hyperglycemia: Secondary | ICD-10-CM | POA: Diagnosis not present

## 2020-08-09 DIAGNOSIS — E782 Mixed hyperlipidemia: Secondary | ICD-10-CM

## 2020-08-09 DIAGNOSIS — E039 Hypothyroidism, unspecified: Secondary | ICD-10-CM

## 2020-08-09 DIAGNOSIS — E1069 Type 1 diabetes mellitus with other specified complication: Secondary | ICD-10-CM | POA: Diagnosis not present

## 2020-08-09 DIAGNOSIS — E559 Vitamin D deficiency, unspecified: Secondary | ICD-10-CM | POA: Diagnosis not present

## 2020-08-09 NOTE — Progress Notes (Signed)
Virtual Visit via telephone Note Due to COVID-19 pandemic this visit was conducted virtually. This visit type was conducted due to national recommendations for restrictions regarding the COVID-19 Pandemic (e.g. social distancing, sheltering in place) in an effort to limit this patient's exposure and mitigate transmission in our community. All issues noted in this document were discussed and addressed.  A physical exam was not performed with this format.  I connected with [name] on 08/09/20 at 12:47 pm  by telephone and verified that I am speaking with the correct person using two identifiers. [name] is currently located at work and n one is currently with him  during visit. The provider, Evelina Dun, FNP is located in their office at time of visit.  I discussed the limitations, risks, security and privacy concerns of performing an evaluation and management service by telephone and the availability of in person appointments. I also discussed with the patient that there may be a patient responsible charge related to this service. The patient expressed understanding and agreed to proceed.   History and Present Illness:  Pt calls the office today for chronic follow up.  Diabetes He presents for his follow-up diabetic visit. He has type 2 diabetes mellitus. His disease course has been fluctuating. There are no hypoglycemic associated symptoms. Associated symptoms include fatigue. Pertinent negatives for diabetes include no blurred vision and no foot paresthesias. There are no hypoglycemic complications. Symptoms are stable. Pertinent negatives for diabetic complications include no CVA, nephropathy or peripheral neuropathy. Risk factors for coronary artery disease include dyslipidemia, diabetes mellitus, male sex, hypertension and sedentary lifestyle. He is following a generally healthy diet. His overall blood glucose range is 180-200 mg/dl. An ACE inhibitor/angiotensin II receptor  blocker is contraindicated. Eye exam is current.  Hyperlipidemia This is a chronic problem. The current episode started more than 1 year ago. The problem is controlled. Current antihyperlipidemic treatment includes statins. The current treatment provides moderate improvement of lipids. Risk factors for coronary artery disease include dyslipidemia, diabetes mellitus, male sex, hypertension and a sedentary lifestyle.  Thyroid Problem Presents for follow-up visit. Symptoms include fatigue. Patient reports no constipation, diaphoresis or diarrhea. The symptoms have been stable. His past medical history is significant for hyperlipidemia.      Review of Systems  Constitutional: Positive for fatigue. Negative for diaphoresis.  Eyes: Negative for blurred vision.  Gastrointestinal: Negative for constipation and diarrhea.  All other systems reviewed and are negative.    Observations/Objective: No SOB or distress noted  Assessment and Plan: [name] comes in today with chief complaint of No chief complaint on file.   Diagnosis and orders addressed:  1. Acquired hypothyroidism - CMP14+EGFR - CBC with Differential/Platelet - TSH  2. 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 Type 1 diabetes mellitus with hyperglycemia (HCC) - Bayer DCA Hb A1c Waived - CMP14+EGFR - CBC with Differential/Platelet - Microalbumin / creatinine urine ratio  4. Vitamin D deficiency - CMP14+EGFR - CBC with Differential/Platelet   Labs pending Health Maintenance reviewed- Pt will think about colonoscopy.  Diet and exercise encouraged  Follow up plan: 3 months       I discussed the assessment and treatment plan with the patient. The patient was provided an opportunity to ask questions and all were answered. The patient agreed with the plan and demonstrated an understanding of the instructions.   The patient  was advised to call back or  seek an in-person evaluation if the symptoms worsen or if the condition fails to improve as anticipated.  The above assessment and management plan was discussed with the patient. The patient verbalized understanding of and has agreed to the management plan. Patient is aware to call the clinic if symptoms persist or worsen. Patient is aware when to return to the clinic for a follow-up visit. Patient educated on when it is appropriate to go to the emergency department.   Time call ended:  1:08 pm   I provided 21 minutes of non-face-to-face time during this encounter.    Evelina Dun, FNP

## 2020-09-18 ENCOUNTER — Other Ambulatory Visit: Payer: Self-pay | Admitting: Family

## 2020-09-18 DIAGNOSIS — E559 Vitamin D deficiency, unspecified: Secondary | ICD-10-CM

## 2020-09-18 DIAGNOSIS — E1065 Type 1 diabetes mellitus with hyperglycemia: Secondary | ICD-10-CM

## 2020-09-21 ENCOUNTER — Other Ambulatory Visit: Payer: Self-pay | Admitting: Family Medicine

## 2020-09-21 ENCOUNTER — Other Ambulatory Visit: Payer: Self-pay | Admitting: Family

## 2020-09-30 ENCOUNTER — Other Ambulatory Visit: Payer: Self-pay | Admitting: Family

## 2020-09-30 DIAGNOSIS — E559 Vitamin D deficiency, unspecified: Secondary | ICD-10-CM

## 2020-10-11 ENCOUNTER — Other Ambulatory Visit: Payer: Self-pay | Admitting: Family

## 2020-10-11 DIAGNOSIS — E559 Vitamin D deficiency, unspecified: Secondary | ICD-10-CM

## 2020-12-14 ENCOUNTER — Telehealth: Payer: Self-pay | Admitting: Family

## 2020-12-14 DIAGNOSIS — E782 Mixed hyperlipidemia: Secondary | ICD-10-CM

## 2020-12-14 MED ORDER — VITAMIN D (ERGOCALCIFEROL) 1.25 MG (50000 UNIT) PO CAPS: 50000.0000 [IU] | ORAL_CAPSULE | ORAL | 3 refills | Status: DC

## 2020-12-14 NOTE — Telephone Encounter (Signed)
Pt would like refill on Rx Vit D, last level was WNL 60.2 on 04/23/20. I had denied last month and suggested OTC 1000-2000u daily Please advise

## 2020-12-14 NOTE — Telephone Encounter (Signed)
  Prescription Request  12/14/2020  What is the name of the medication or equipment? Rosuvastatin, freestyle sensor and levothyroxine  Have you contacted your pharmacy to request a refill? (if applicable) yes  Which pharmacy would you like this sent to? [facility] [place]   Patient notified that their request is being sent to the clinical staff for review and that they should receive a response within 2 business days.

## 2020-12-14 NOTE — Addendum Note (Signed)
Addended by: Jannifer Rodney A on: 12/14/2020 03:24 PM   Modules accepted: Orders

## 2020-12-14 NOTE — Telephone Encounter (Signed)
Prescription sent to pharmacy.

## 2020-12-14 NOTE — Telephone Encounter (Signed)
  Prescription Request  12/14/2020  What is the name of the medication or equipment? Vitamin D   Have you contacted your pharmacy to request a refill? (if applicable) yes  Which [facility] you [facility]   Patient notified that their request is being sent to the clinical staff for review and that they should receive a response within 2 business days.

## 2020-12-17 MED ORDER — LEVOTHYROXINE SODIUM 150 MCG PO TABS: 150.0000 ug | ORAL_TABLET | Freq: Every day | ORAL | 0 refills | Status: DC

## 2020-12-17 MED ORDER — FREESTYLE LIBRE 14 DAY SENSOR MISC: 3 refills | Status: DC

## 2020-12-17 MED ORDER — ROSUVASTATIN CALCIUM 20 MG PO TABS: 20.0000 mg | ORAL_TABLET | Freq: Every day | ORAL | 0 refills | Status: DC

## 2020-12-17 NOTE — Telephone Encounter (Signed)
All sent to pharmacy called patient and he is aware

## 2020-12-17 NOTE — Addendum Note (Signed)
Addended by: Ignacia Bayley on: 12/17/2020 10:16 AM   Modules accepted: Orders

## 2021-01-17 ENCOUNTER — Other Ambulatory Visit: Payer: Self-pay

## 2021-01-17 ENCOUNTER — Encounter: Payer: Self-pay | Admitting: Family

## 2021-01-17 ENCOUNTER — Ambulatory Visit (INDEPENDENT_AMBULATORY_CARE_PROVIDER_SITE_OTHER): Payer: 59 | Admitting: Family

## 2021-01-17 VITALS — BP 98/66 | HR 89 | Temp 98.0°F | Ht 73.0 in | Wt 165.4 lb

## 2021-01-17 DIAGNOSIS — Z1211 Encounter for screening for malignant neoplasm of colon: Secondary | ICD-10-CM

## 2021-01-17 DIAGNOSIS — E039 Hypothyroidism, unspecified: Secondary | ICD-10-CM | POA: Diagnosis not present

## 2021-01-17 DIAGNOSIS — K219 Gastro-esophageal reflux disease without esophagitis: Secondary | ICD-10-CM

## 2021-01-17 DIAGNOSIS — E559 Vitamin D deficiency, unspecified: Secondary | ICD-10-CM | POA: Diagnosis not present

## 2021-01-17 DIAGNOSIS — E1065 Type 1 diabetes mellitus with hyperglycemia: Secondary | ICD-10-CM | POA: Diagnosis not present

## 2021-01-17 DIAGNOSIS — E1069 Type 1 diabetes mellitus with other specified complication: Secondary | ICD-10-CM | POA: Diagnosis not present

## 2021-01-17 DIAGNOSIS — E782 Mixed hyperlipidemia: Secondary | ICD-10-CM

## 2021-01-17 LAB — BAYER DCA HB A1C WAIVED: HB A1C (BAYER DCA - WAIVED): 8 % — ABNORMAL HIGH (ref <7.0)

## 2021-01-17 MED ORDER — FAMOTIDINE 20 MG PO TABS: 20.0000 mg | ORAL_TABLET | Freq: Every day | ORAL | 1 refills | Status: DC

## 2021-01-17 MED ORDER — TRESIBA FLEXTOUCH 100 UNIT/ML ~~LOC~~ SOPN: 15.0000 [IU] | PEN_INJECTOR | Freq: Every day | SUBCUTANEOUS | 0 refills | Status: DC

## 2021-01-17 NOTE — Progress Notes (Signed)
Subjective:    Patient ID: [name], male    DOB: September 30, 1970, 50 y.o.   MRN: [id number]  Chief Complaint  Patient presents with   Medical Management of Chronic Issues   Diabetes   Hypothyroidism   Hyperlipidemia   Pt calls the office today for chronic follow up. He reports he has noticed increased tremors while typing, eating, and doing activities. Denies at rest.  Diabetes He presents for his follow-up diabetic visit. He has type 1 diabetes mellitus. His disease course has been stable. There are no hypoglycemic associated symptoms. Associated symptoms include fatigue. Pertinent negatives for diabetes include no blurred vision, no foot paresthesias and no visual change. Symptoms are stable. Pertinent negatives for diabetic complications include no CVA, heart disease, nephropathy or peripheral neuropathy. Risk factors for coronary artery disease include dyslipidemia, diabetes mellitus and male sex. He is following a generally healthy diet. His overall blood glucose range is 140-180 mg/dl. Eye exam is not current.  Hyperlipidemia This is a chronic problem. The current episode started more than 1 year ago. The problem is controlled. Recent lipid tests were reviewed and are normal. Current antihyperlipidemic treatment includes statins. The current treatment provides moderate improvement of lipids. Risk factors for coronary artery disease include dyslipidemia, diabetes mellitus, male sex and a sedentary lifestyle.  Thyroid Problem Presents for follow-up visit. Symptoms include fatigue. Patient reports no hoarse voice or visual change. The symptoms have been stable. His past medical history is significant for hyperlipidemia.  Gastroesophageal Reflux He complains of belching and heartburn. He reports no hoarse voice. This is a chronic problem. The current episode started more than 1 year ago. The problem occurs occasionally. The problem has been waxing and waning. Associated symptoms include  fatigue. He has tried an antacid for the symptoms. The treatment provided mild relief.     Review of Systems  Constitutional:  Positive for fatigue.  HENT:  Negative for hoarse voice.   Eyes:  Negative for blurred vision.  Gastrointestinal:  Positive for heartburn.  All other systems reviewed and are negative.     Objective:   Physical Exam Vitals reviewed.  Constitutional:      General: He is not in acute distress.    Appearance: He is well-developed.  HENT:     Head: Normocephalic.     Right Ear: Tympanic membrane normal.     Left Ear: Tympanic membrane normal.  Eyes:     General:        Right eye: No discharge.        Left eye: No discharge.     Pupils: Pupils are equal, round, and reactive to light.  Neck:     Thyroid: No thyromegaly.  Cardiovascular:     Rate and Rhythm: Normal rate and regular rhythm.     Heart sounds: Normal heart sounds. No murmur heard. Pulmonary:     Effort: Pulmonary effort is normal. No respiratory distress.     Breath sounds: Normal breath sounds. No wheezing.  Abdominal:     General: Bowel sounds are normal. There is no distension.     Palpations: Abdomen is soft.     Tenderness: There is no abdominal tenderness.  Musculoskeletal:        General: No tenderness. Normal range of motion.     Cervical back: Normal range of motion and neck supple.  Skin:    General: Skin is warm and dry.     Findings: No erythema or rash.  Neurological:  Mental Status: He is alert and oriented to person, place, and time.     Cranial Nerves: No cranial nerve deficit.     Deep Tendon Reflexes: Reflexes are normal and symmetric.  Psychiatric:        Behavior: Behavior normal.        Thought Content: Thought content normal.        Judgment: Judgment normal.   Diabetic Foot Exam - Simple   Simple Foot Form Diabetic Foot exam was performed with the following findings: Yes 01/17/2021  8:56 AM  Visual Inspection No deformities, no ulcerations, no other  skin breakdown bilaterally: Yes Sensation Testing Intact to touch and monofilament testing bilaterally: Yes Pulse Check Posterior Tibialis and Dorsalis pulse intact bilaterally: Yes Comments       BP 98/66   Pulse 89   Temp 98 F (36.7 C) (Oral)   Ht 6\' 1"  (1.854 m)   Wt 165 lb 6 oz (75 kg)   BMI 21.82 kg/m      Assessment & Plan:  MILBERT BIXLER comes in today with chief complaint of Medical Management of Chronic Issues, Diabetes, Hypothyroidism, and Hyperlipidemia   Diagnosis and orders addressed:  1. Type 1 diabetes mellitus with hyperglycemia (HCC) -Increase Tresiba to 15-18 units nightly.  - Microalbumin / creatinine urine ratio - insulin degludec (TRESIBA FLEXTOUCH) 100 UNIT/ML FlexTouch Pen; Inject 15 Units into the skin at bedtime.  Dispense: 15 mL; Refill: 0  2. Acquired hypothyroidism  3. 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 Vitamin D deficiency  5. Colon cancer screening  - Ambulatory referral to Colorectal Surgery  6. Gastroesophageal reflux disease, unspecified whether esophagitis present -Start Pepcid today -Diet discussed- Avoid fried, spicy, citrus foods, caffeine and alcohol -Do not eat 2-3 hours before bedtime -Encouraged small frequent meals -Avoid NSAID's - famotidine (PEPCID) 20 MG tablet; Take 1 tablet (20 mg total) by mouth daily.  Dispense: 90 tablet; Refill: 1   Labs pending Health Maintenance reviewed Diet and exercise encouraged  Follow up plan: 3 months   Billee Cashing, FNP

## 2021-01-17 NOTE — Patient Instructions (Addendum)
[employer]'s Current Therapy 2021 (pp. 213-216). [place], [place]: Elsevier.">  Gastroesophageal Reflux Disease, Adult Gastroesophageal reflux (GER) happens when acid from the stomach flows up into the tube that connects the mouth and the stomach (esophagus). Normally, food travels down the esophagus and stays in the stomach to be digested. However, when a person has GER, food and stomach acid sometimes move back up into the esophagus. If this becomes a more serious problem, the person may be diagnosed with a disease called gastroesophageal reflux disease (GERD). GERD occurs when the reflux: Happens often. Causes frequent or severe symptoms. Causes problems such as damage to the esophagus. When stomach acid comes in contact with the esophagus, the acid may cause inflammation in the esophagus. Over time, GERD may create small holes (ulcers) in the lining of the esophagus. What are the causes? This condition is caused by a problem with the muscle between the esophagus and the stomach (lower esophageal sphincter, or LES). Normally, the LES muscle closes after food passes through the esophagus to the stomach. When the LES is weakened or abnormal, it does not close properly, and that allows food and stomach acid to go back up into theesophagus. The LES can be weakened by certain dietary substances, medicines, and medical conditions, including: Tobacco use. Pregnancy. Having a hiatal hernia. Alcohol use. Certain foods and beverages, such as coffee, chocolate, onions, and peppermint. What increases the risk? You are more likely to develop this condition if you: Have an increased body weight. Have a connective tissue disorder. Take NSAIDs, such as ibuprofen. What are the signs or symptoms? Symptoms of this condition include: Heartburn. Difficult or painful swallowing and the feeling of having a lump in the throat. A bitter taste in the mouth. Bad breath and having a large amount of saliva. Having an  upset or bloated stomach and belching. Chest pain. Different conditions can cause chest pain. Make sure you see your health care provider if you experience chest pain. Shortness of breath or wheezing. Ongoing (chronic) cough or a nighttime cough. Wearing away of tooth enamel. Weight loss. How is this diagnosed? This condition may be diagnosed based on a medical history and a physical exam. To determine if you have mild or severe GERD, your health care provider may also monitor how you respond to treatment. You may also have tests, including: A test to examine your stomach and esophagus with a small camera (endoscopy). A test that measures the acidity level in your esophagus. A test that measures how much pressure is on your esophagus. A barium swallow or modified barium swallow test to show the shape, size, and functioning of your esophagus. How is this treated? Treatment for this condition may vary depending on how severe your symptoms are. Your health care provider may recommend: Changes to your diet. Medicine. Surgery. The goal of treatment is to help relieve your symptoms and to preventcomplications. Follow these instructions at home: Eating and drinking  Follow a diet as recommended by your health care provider. This may involve avoiding foods and drinks such as: Coffee and tea, with or without caffeine. Drinks that contain alcohol. Energy drinks and sports drinks. Carbonated drinks or sodas. Chocolate and cocoa. Peppermint and mint flavorings. Garlic and onions. Horseradish. Spicy and acidic foods, including peppers, chili powder, curry powder, vinegar, hot sauces, and barbecue sauce. Citrus fruit juices and citrus fruits, such as oranges, lemons, and limes. Tomato-based foods, such as red sauce, chili, salsa, and pizza with red sauce. Fried and fatty foods, such as   donuts, french fries, potato chips, and high-fat dressings. High-fat meats, such as hot dogs and fatty cuts of  red and white meats, such as rib eye steak, sausage, ham, and bacon. High-fat dairy items, such as whole milk, butter, and cream cheese. Eat small, frequent meals instead of large meals. Avoid drinking large amounts of liquid with your meals. Avoid eating meals during the 2-3 hours before bedtime. Avoid lying down right after you eat. Do not exercise right after you eat.  Lifestyle  Do not use any products that contain nicotine or tobacco. These products include cigarettes, chewing tobacco, and vaping devices, such as e-cigarettes. If you need help quitting, ask your health care provider. Try to reduce your stress by using methods such as yoga or meditation. If you need help reducing stress, ask your health care provider. If you are overweight, reduce your weight to an amount that is healthy for you. Ask your health care provider for guidance about a safe weight loss goal.  General instructions Pay attention to any changes in your symptoms. Take over-the-counter and prescription medicines only as told by your health care provider. Do not take aspirin, ibuprofen, or other NSAIDs unless your health care provider told you to take these medicines. Wear loose-fitting clothing. Do not wear anything tight around your waist that causes pressure on your abdomen. Raise (elevate) the head of your bed about 6 inches (15 cm). You can use a wedge to do this. Avoid bending over if this makes your symptoms worse. Keep all follow-up visits. This is important. Contact a health care provider if: You have: New symptoms. Unexplained weight loss. Difficulty swallowing or it hurts to swallow. Wheezing or a persistent cough. A hoarse voice. Your symptoms do not improve with treatment. Get help right away if: You have sudden pain in your arms, neck, jaw, teeth, or back. You suddenly feel sweaty, dizzy, or light-headed. You have chest pain or shortness of breath. You vomit and the vomit is green, yellow, or  black, or it looks like blood or coffee grounds. You faint. You have stool that is red, bloody, or black. You cannot swallow, drink, or eat. These symptoms may represent a serious problem that is an emergency. Do not wait to see if the symptoms will go away. Get medical help right away. Call your local emergency services (911 in the [place] Do not drive yourself to the hospital. Summary Gastroesophageal reflux happens when acid from the stomach flows up into the esophagus. GERD is a disease in which the reflux happens often, causes frequent or severe symptoms, or causes problems such as damage to the esophagus. Treatment for this condition may vary depending on how severe your symptoms are. Your health care provider may recommend diet and lifestyle changes, medicine, or surgery. Contact a health care provider if you have new or worsening symptoms. Take over-the-counter and prescription medicines only as told by your health care provider. Do not take aspirin, ibuprofen, or other NSAIDs unless your health care provider told you to do so. Keep all follow-up visits as told by your health care provider. This is important. This information is not intended to replace advice given to you by your health care provider. Make sure you discuss any questions you have with your healthcare provider. Document Revised: 01/23/2020 Document Reviewed: 01/23/2020 Elsevier Patient Education  2022 [employer] Inc. Tremor A tremor is trembling or shaking that you cannot control. Most tremors affect the hands or arms. Tremors can also affect the head, vocal cords,  face, and other parts of the body. There are many types of tremors. Common types include: Essential tremor. These usually occur in people older than 40. It may run in families and can happen in otherwise healthy people. Resting tremor. These occur when the muscles are at rest, such as when your hands are resting in your lap. People with Parkinson's disease often have  resting tremors. Postural tremor. These occur when you try to hold a pose, such as keeping your hands outstretched. Kinetic tremor. These occur during purposeful movement, such as trying to touch a finger to your nose. Task-specific tremor. These may occur when you perform certain tasks such as writing, speaking, or standing. Psychogenic tremor. These dramatically lessen or disappear when you are distracted. They can happen in people of all ages. Some types of tremors have no known cause. Tremors can also be a symptom of nervous system problems (neurological disorders) that may occur with aging. Some tremors go away with treatment, while othersdo not. Follow these instructions at home: Lifestyle     Limit alcohol intake to no more than 1 drink a day for nonpregnant women and 2 drinks a day for men. One drink equals 12 oz of beer, 5 oz of wine, or 1 oz of hard liquor. Do not use any products that contain nicotine or tobacco, such as cigarettes and e-cigarettes. If you need help quitting, ask your health care provider. Avoid extreme heat and extreme cold. Limit your caffeine intake, as told by your health care provider. Try to get 8 hours of sleep each night. Find ways to manage your stress, such as meditation or yoga. General instructions Take over-the-counter and prescription medicines only as told by your health care provider. Keep all follow-up visits as told by your health care provider. This is important. Contact a health care provider if you: Develop a tremor after starting a new medicine. Have a tremor along with other symptoms such as: Numbness. Tingling. Pain. Weakness. Notice that your tremor gets worse. Notice that your tremor interferes with your day-to-day life. Summary A tremor is trembling or shaking that you cannot control. Most tremors affect the hands or arms. Some types of tremors have no known cause. Others may be a symptom of nervous system problems (neurological  disorders). Make sure you discuss any tremors you have with your health care provider. This information is not intended to replace advice given to you by your health care provider. Make sure you discuss any questions you have with your healthcare provider. Document Revised: 04/06/2020 Document Reviewed: 04/06/2020 Elsevier Patient Education  2022 [employer].

## 2021-01-18 ENCOUNTER — Other Ambulatory Visit: Payer: Self-pay | Admitting: *Deleted

## 2021-01-18 LAB — CBC WITH DIFFERENTIAL/PLATELET
Basophils Absolute: 0 10*3/uL (ref 0.0–0.2)
Basos: 0 %
EOS (ABSOLUTE): 0.2 10*3/uL (ref 0.0–0.4)
Eos: 4 %
Hematocrit: 30.6 % — ABNORMAL LOW (ref 37.5–51.0)
Hemoglobin: 10.6 g/dL — ABNORMAL LOW (ref 13.0–17.7)
Immature Grans (Abs): 0 10*3/uL (ref 0.0–0.1)
Immature Granulocytes: 0 %
Lymphocytes Absolute: 2.4 10*3/uL (ref 0.7–3.1)
Lymphs: 53 %
MCH: 39.1 pg — ABNORMAL HIGH (ref 26.6–33.0)
MCHC: 34.6 g/dL (ref 31.5–35.7)
MCV: 113 fL — ABNORMAL HIGH (ref 79–97)
Monocytes Absolute: 0.3 10*3/uL (ref 0.1–0.9)
Monocytes: 6 %
NRBC: 1 % — ABNORMAL HIGH (ref 0–0)
Neutrophils Absolute: 1.7 10*3/uL (ref 1.4–7.0)
Neutrophils: 37 %
Platelets: 229 10*3/uL (ref 150–450)
RBC: 2.71 x10E6/uL — CL (ref 4.14–5.80)
RDW: 17.5 % — ABNORMAL HIGH (ref 11.6–15.4)
WBC: 4.6 10*3/uL (ref 3.4–10.8)

## 2021-01-18 LAB — TSH: TSH: 2.61 u[IU]/mL (ref 0.450–4.500)

## 2021-01-18 LAB — CMP14+EGFR
ALT: 15 IU/L (ref 0–44)
AST: 24 IU/L (ref 0–40)
Albumin/Globulin Ratio: 2 (ref 1.2–2.2)
Albumin: 3.9 g/dL — ABNORMAL LOW (ref 4.0–5.0)
Alkaline Phosphatase: 79 IU/L (ref 44–121)
BUN/Creatinine Ratio: 15 (ref 9–20)
BUN: 15 mg/dL (ref 6–24)
Bilirubin Total: 0.9 mg/dL (ref 0.0–1.2)
CO2: 25 mmol/L (ref 20–29)
Calcium: 8.5 mg/dL — ABNORMAL LOW (ref 8.7–10.2)
Chloride: 101 mmol/L (ref 96–106)
Creatinine, Ser: 1.02 mg/dL (ref 0.76–1.27)
Globulin, Total: 2 g/dL (ref 1.5–4.5)
Glucose: 140 mg/dL — ABNORMAL HIGH (ref 65–99)
Potassium: 4.2 mmol/L (ref 3.5–5.2)
Sodium: 138 mmol/L (ref 134–144)
Total Protein: 5.9 g/dL — ABNORMAL LOW (ref 6.0–8.5)
eGFR: 91 mL/min/{1.73_m2} (ref >59)

## 2021-01-18 NOTE — Progress Notes (Signed)
Pt called and aware of all - no sign of bleeding Add-0n sheet taken to lab  [id number]/24-jhb

## 2021-01-21 ENCOUNTER — Other Ambulatory Visit: Payer: Self-pay

## 2021-01-21 ENCOUNTER — Other Ambulatory Visit: Payer: 59

## 2021-01-21 ENCOUNTER — Other Ambulatory Visit: Payer: Self-pay | Admitting: Family

## 2021-01-21 DIAGNOSIS — Z1211 Encounter for screening for malignant neoplasm of colon: Secondary | ICD-10-CM

## 2021-01-22 ENCOUNTER — Other Ambulatory Visit: Payer: Self-pay | Admitting: Family

## 2021-01-22 DIAGNOSIS — Z1211 Encounter for screening for malignant neoplasm of colon: Secondary | ICD-10-CM

## 2021-01-22 LAB — VITAMIN D 25 HYDROXY (VIT D DEFICIENCY, FRACTURES): Vit D, 25-Hydroxy: 82.4 ng/mL (ref 30.0–100.0)

## 2021-01-22 LAB — PTH, INTACT AND CALCIUM
Calcium: 8.6 mg/dL — ABNORMAL LOW (ref 8.7–10.2)
PTH: 57 pg/mL (ref 15–65)

## 2021-01-23 ENCOUNTER — Other Ambulatory Visit: Payer: Self-pay | Admitting: Family

## 2021-01-23 DIAGNOSIS — D649 Anemia, unspecified: Secondary | ICD-10-CM

## 2021-01-30 ENCOUNTER — Encounter: Payer: Self-pay | Admitting: Internal Medicine

## 2021-03-06 ENCOUNTER — Ambulatory Visit: Payer: 59

## 2021-03-11 ENCOUNTER — Other Ambulatory Visit: Payer: Self-pay

## 2021-03-11 ENCOUNTER — Telehealth: Payer: Self-pay

## 2021-03-11 ENCOUNTER — Encounter: Payer: Self-pay | Admitting: Gastroenterology

## 2021-03-11 ENCOUNTER — Ambulatory Visit: Payer: 59 | Admitting: Gastroenterology

## 2021-03-11 ENCOUNTER — Encounter (INDEPENDENT_AMBULATORY_CARE_PROVIDER_SITE_OTHER): Payer: Self-pay

## 2021-03-11 VITALS — Ht 70.0 in | Wt 161.2 lb

## 2021-03-11 VITALS — BP 98/56 | HR 78 | Temp 97.3°F | Ht 70.0 in | Wt 161.2 lb

## 2021-03-11 DIAGNOSIS — Z1211 Encounter for screening for malignant neoplasm of colon: Secondary | ICD-10-CM

## 2021-03-11 DIAGNOSIS — R6881 Early satiety: Secondary | ICD-10-CM | POA: Diagnosis not present

## 2021-03-11 DIAGNOSIS — K219 Gastro-esophageal reflux disease without esophagitis: Secondary | ICD-10-CM | POA: Diagnosis not present

## 2021-03-11 MED ORDER — PANTOPRAZOLE SODIUM 40 MG PO TBEC: 40.0000 mg | DELAYED_RELEASE_TABLET | Freq: Every day | ORAL | 3 refills | Status: DC

## 2021-03-11 NOTE — Telephone Encounter (Signed)
Called pt, TCS w/Dr. Jena Gauss scheduled for 04/17/21 at 7:30am. He was given Clenpiq sample today by triage nurse. Procedure instructions mailed. Orders entered for procedure. Ermalinda Memos PA advised pt would need CBG on arrival day of procedure.

## 2021-03-11 NOTE — Progress Notes (Signed)
Referring Provider:Hawks, Edilia Bo, FNP Primary Care Physician:  Junie Spencer, FNP Primary Gastroenterologist:  Dr. Jena Gauss  Chief Complaint  Patient presents with   Consult    TCS was here for triage   EGD    Reports has bad acid reflux despite being on treatment.    HPI:   [name] is a 50 y.o. male presenting today at the request of Junie Spencer, FNP for colon cancer screening.   Patient was initially here today for a triage visit to schedule his colonoscopy.  However, he reported some upper GI issues and requested an EGD.  This was scheduled for an office visit for further evaluation.  Today:  Reflux started about 1/5 years ago, associated with vomiting.  Started Pepcid a few weeks ago for reflux which is helping, but he continues with symptoms a couple times a week. Previously tried Prilosec in September 2021. Took for a very short time due to "haziness".  States he also was reading about other possible side effects and became very concerned about this, so he stopped the medication.  Symptoms can be triggered by anxiety, eating, or drinking soda. Denies nausea, vomiting, dysphagia.    Admits to early satiety. Used to be a heavier eater. Doesn't take much to fill him up. Problems with broccoli. No unintentional weight loss.   Last colonoscopy was in [place] about 25 years ago.  Pursued due to abdominal pain. No polyps.  Denies constipation, diarrhea, BRBPR, melena.  Aleve twice a  month.   Tested positive for COVID a little over a week ago.  Feeling well at this time.  Denies fever, chills, cold or flulike symptoms, shortness of breath except with exertion which is normal for him, cough, chest pain, heart palpitations.  Past Medical History:  Diagnosis Date   Diabetes mellitus without complication (HCC)    Type 1   Hyperlipidemia    Hypothyroidism     History reviewed. No pertinent surgical history.  Current Outpatient Medications  Medication Sig  Dispense Refill   aspirin 81 MG tablet Take 81 mg by mouth daily. Reported on 11/13/2015     Continuous Blood Gluc Receiver (FREESTYLE LIBRE READER) DEVI 1 Device by Does not apply route 6 (six) times daily. 6 each 0   Continuous Blood Gluc Sensor (FREESTYLE LIBRE 14 DAY SENSOR) MISC Use to check BS Six times daily and PRN Dx E11.9 6 each 3   famotidine (PEPCID) 20 MG tablet Take 1 tablet (20 mg total) by mouth daily. 90 tablet 1   insulin aspart (NOVOLOG FLEXPEN) 100 UNIT/ML FlexPen INJECT AS NEEDED FOR ELEVATED BG, OVER 200 - GIVE: 2 UNITS; 251-300- 3 UNITS; 301 OR OVER - 4 UNITS (THIS IS A 75 DAY SUPPLY) 15 mL 2   insulin degludec (TRESIBA FLEXTOUCH) 100 UNIT/ML FlexTouch Pen Inject 15 Units into the skin at bedtime. 15 mL 0   insulin NPH-regular Human (70-30) 100 UNIT/ML injection Inject 15 Units into the skin 2 (two) times daily with a meal. 10 mL 6   levothyroxine (SYNTHROID) 150 MCG tablet Take 1 tablet (150 mcg total) by mouth daily. 90 tablet 0   pantoprazole (PROTONIX) 40 MG tablet Take 1 tablet (40 mg total) by mouth daily before breakfast. 30 tablet 3   rosuvastatin (CRESTOR) 20 MG tablet Take 1 tablet (20 mg total) by mouth daily. 90 tablet 0   Vitamin D, Ergocalciferol, (DRISDOL) 1.25 MG (50000 UNIT) CAPS capsule Take 1 capsule (50,000 Units total) by mouth  every 7 (seven) days. 12 capsule 3   No current facility-administered medications for this visit.    Allergies as of 03/11/2021   (No Known Allergies)    Family History  Problem Relation Age of Onset   Muscular dystrophy Mother    Diabetes Father    Colon cancer Neg Hx     Social History   Socioeconomic History   Marital status: Married    Spouse name: Not on file   Number of children: Not on file   Years of education: Not on file   Highest education level: Not on file  Occupational History   Not on file  Tobacco Use   Smoking status: Never   Smokeless tobacco: Never  Vaping Use   Vaping Use: Never used   Substance and Sexual Activity   Alcohol use: No   Drug use: No   Sexual activity: Yes  Other Topics Concern   Not on file  Social History Narrative   Not on file   Social Determinants of Health   Financial Resource Strain: Not on file  Food Insecurity: Not on file  Transportation Needs: Not on file  Physical Activity: Not on file  Stress: Not on file  Social Connections: Not on file  Intimate Partner Violence: Not on file    Review of Systems: Gen: See HPI CV: See HPI Resp: See HPI GI: See HPI GU : Denies urinary burning, urinary frequency, urinary hesitancy MS: Denies joint pain Derm: Denies rash Psych: Denies depression, anxiety Heme: See HPI  Physical Exam: BP (!) 98/56   Pulse 78   Temp (!) 97.3 F (36.3 C)   Ht 5\' 10"  (1.778 m)   Wt 161 lb 3.2 oz (73.1 kg)   BMI 23.13 kg/m  General:   Alert and oriented. Pleasant and cooperative. Well-nourished and well-developed.  Head:  Normocephalic and atraumatic. Eyes:  Without icterus, sclera clear and conjunctiva pink.  Lungs:  Clear to auscultation bilaterally. No wheezes, rales, or rhonchi. No distress.  Heart:  S1, S2 present without murmurs appreciated.  Abdomen:  +BS, soft, non-tender and non-distended. No HSM noted. No guarding or rebound. No masses appreciated.  Rectal:  Deferred  Msk:  Symmetrical without gross deformities. Normal posture. Extremities:  Without edema. Neurologic:  Alert and  oriented x4;  grossly normal neurologically. Skin:  Intact without significant lesions or rashes. Psych: Normal mood and affect.    Assessment: 50 year old male with history of type 1 diabetes, HLD, hypothyroidism presenting today to schedule screening colonoscopy and to discuss GERD.  Colon cancer screening: Reports having a colonoscopy about 25 years ago in 55 without polyps.  No significant lower GI symptoms.  No alarm symptoms.  No family history of colon cancer.  GERD: Reports 1.5-year history of  reflux with associated vomiting.  Prior trial of Prilosec 20 mg for very short time, but stopped medication due to reported "haziness" and nervousness about possible side effects after reading package insert.  Currently on Pepcid 20 mg daily which is helping, but he continues with symptoms about twice a week.  Denies dysphagia, melena, abdominal pain, or unintentional weight loss.  He does report some early satiety which could be secondary to diabetes and possibly gastroparesis.  We discussed the possibility of an upper endoscopy, but ultimately decided to trial Protonix 40 mg daily.  If symptoms persist, would consider EGD for further evaluation.   Plan: Proceed with colonoscopy with Dr. [place] in the near future. The risks, benefits, and alternatives have  been discussed with the patient in detail. The patient states understanding and desires to proceed. ASA II 1 day prior to procedure: One half dose of Tresiba (7.5 units) at bedtime.  He is to continue mealtime insulin and sliding scale insulin as needed. Day of procedure: No morning diabetes medications. Counseled on the importance of monitoring blood sugars closely while prepping for his colonoscopy and correct any low blood sugars with improved sugary clear liquids. Stop famotidine and start Protonix 40 mg daily 30 minutes before breakfast.  Counseled on GERD diet/lifestyle.  Written instructions provided. (See AVS). Follow-up colonoscopy.    Ermalinda Memos, PA-C [facility] 03/11/2021

## 2021-03-11 NOTE — Patient Instructions (Addendum)
We will arrange for you to have a colonoscopy in the near future with Dr. Jena Gauss. 1 day prior to procedure: Take one half dose of Tresiba (7.5 units) at bedtime. Continue taking mealtime insulin as needed and additional sliding scale insulin as needed.   Day of your procedure: Take any diabetes medications this morning.  For reflux: Start Protonix 40 mg daily 30 minutes before breakfast.  Have sent a prescription to [facility] pharmacy. Avoid fried, fatty, greasy, spicy, citrus foods. Avoid caffeine and carbonated beverages. Avoid chocolate. Try eating 4-6 small meals a day rather than 3 large meals. Do not eat within 3 hours of laying down. Prop head of bed up on wood or bricks to create a 6 inch incline.  We will plan to follow-up with you in the office after your colonoscopy.  Do not hesitate to call if you have any questions or concerns prior to your next visit.  It was a pleasure meeting you today!  Ermalinda Memos, PA-C [facility]

## 2021-03-11 NOTE — Progress Notes (Signed)
Pt came in and informed me that he needs EGD/TCS.  Informed me that ov needed to arrange EGD since he is having problems.  Pt voiced understanding and is being seen today by Ermalinda Memos, PA-C.

## 2021-03-12 NOTE — Progress Notes (Addendum)
Noted. Patient was evaluated 8/15 in the office. Please see separate office note for details.

## 2021-04-02 ENCOUNTER — Telehealth: Payer: Self-pay

## 2021-04-02 NOTE — Telephone Encounter (Signed)
Pt called office and LMOVM. Requested to cancel TCS scheduled for 04/17/21. His insurance requires him to be 51 for screening TCS.   Informed endo scheduler to cancel TCS.   Called pt, informed him VM was received and procedure has been cancelled. Advised him to notify office if he has any problems.  FYI to [employer] PA.

## 2021-04-02 NOTE — Telephone Encounter (Signed)
Noted  

## 2021-04-16 ENCOUNTER — Encounter: Payer: Self-pay | Admitting: Family

## 2021-04-16 ENCOUNTER — Ambulatory Visit: Payer: 59 | Admitting: Family

## 2021-04-17 ENCOUNTER — Encounter (HOSPITAL_COMMUNITY): Payer: Self-pay

## 2021-04-17 ENCOUNTER — Ambulatory Visit (HOSPITAL_COMMUNITY): Admit: 2021-04-17 | Payer: 59 | Admitting: Internal Medicine

## 2021-04-17 SURGERY — COLONOSCOPY
Anesthesia: Moderate Sedation

## 2021-05-07 ENCOUNTER — Other Ambulatory Visit: Payer: Self-pay | Admitting: Family

## 2021-05-07 DIAGNOSIS — E782 Mixed hyperlipidemia: Secondary | ICD-10-CM

## 2021-07-13 ENCOUNTER — Other Ambulatory Visit: Payer: Self-pay | Admitting: Family

## 2021-07-13 DIAGNOSIS — E1065 Type 1 diabetes mellitus with hyperglycemia: Secondary | ICD-10-CM

## 2021-08-16 ENCOUNTER — Telehealth: Payer: Self-pay | Admitting: Family

## 2021-08-16 DIAGNOSIS — K219 Gastro-esophageal reflux disease without esophagitis: Secondary | ICD-10-CM

## 2021-08-16 DIAGNOSIS — E782 Mixed hyperlipidemia: Secondary | ICD-10-CM

## 2021-08-16 DIAGNOSIS — E1065 Type 1 diabetes mellitus with hyperglycemia: Secondary | ICD-10-CM

## 2021-08-16 MED ORDER — ROSUVASTATIN CALCIUM 20 MG PO TABS: 20.0000 mg | ORAL_TABLET | Freq: Every day | ORAL | 0 refills | Status: DC

## 2021-08-16 MED ORDER — TRESIBA FLEXTOUCH 100 UNIT/ML ~~LOC~~ SOPN: 15.0000 [IU] | PEN_INJECTOR | Freq: Every day | SUBCUTANEOUS | 0 refills | Status: DC

## 2021-08-16 MED ORDER — FAMOTIDINE 20 MG PO TABS
20.0000 mg | ORAL_TABLET | Freq: Every day | ORAL | 0 refills | Status: AC
Start: 2021-08-16 — End: ?

## 2021-08-16 MED ORDER — LEVOTHYROXINE SODIUM 150 MCG PO TABS: 150.0000 ug | ORAL_TABLET | Freq: Every day | ORAL | 0 refills | Status: AC

## 2021-08-16 NOTE — Telephone Encounter (Signed)
°  Prescription Request  08/16/2021  What is the name of the medication or equipment? ALL  Have you contacted your pharmacy to request a refill? YES  Which pharmacy would you like this sent to? [facility], [place]  Pt made an appt to see PCP on 09/04/21 but is out of most of his meds and needs refills called in to last him until his appt.

## 2021-08-16 NOTE — Telephone Encounter (Signed)
Pt aware refills sent in till his appt on 09/24/21

## 2021-08-18 ENCOUNTER — Other Ambulatory Visit: Payer: Self-pay | Admitting: Gastroenterology

## 2021-08-18 DIAGNOSIS — K219 Gastro-esophageal reflux disease without esophagitis: Secondary | ICD-10-CM

## 2021-08-18 DIAGNOSIS — R6881 Early satiety: Secondary | ICD-10-CM

## 2021-09-04 ENCOUNTER — Ambulatory Visit: Payer: Self-pay | Admitting: Family

## 2021-09-05 ENCOUNTER — Encounter: Payer: Self-pay | Admitting: Family

## 2021-09-06 ENCOUNTER — Other Ambulatory Visit: Payer: Self-pay | Admitting: *Deleted

## 2021-09-06 ENCOUNTER — Telehealth: Payer: Self-pay | Admitting: Family

## 2021-09-06 DIAGNOSIS — E1065 Type 1 diabetes mellitus with hyperglycemia: Secondary | ICD-10-CM

## 2021-09-06 MED ORDER — NOVOLOG FLEXPEN 100 UNIT/ML ~~LOC~~ SOPN: PEN_INJECTOR | SUBCUTANEOUS | 0 refills | Status: DC

## 2021-09-06 NOTE — Telephone Encounter (Signed)
done

## 2021-09-09 ENCOUNTER — Other Ambulatory Visit: Payer: Self-pay | Admitting: Family

## 2021-09-09 ENCOUNTER — Telehealth: Payer: Self-pay | Admitting: Family

## 2021-09-09 DIAGNOSIS — E1065 Type 1 diabetes mellitus with hyperglycemia: Secondary | ICD-10-CM

## 2021-09-09 MED ORDER — NOVOLOG FLEXPEN 100 UNIT/ML ~~LOC~~ SOPN
PEN_INJECTOR | SUBCUTANEOUS | 0 refills | Status: DC
Start: 2021-09-09 — End: 2021-09-10

## 2021-09-09 NOTE — Telephone Encounter (Signed)
Pt still having issues w/ getting his Novolog flexpen filled at [facility] This morning the daily max dose was added & sent back Found fax from [place], insurance does not cover Novolog PA required or send in alternative Sending to PA as high priority, pt has been out since Friday

## 2021-09-10 MED ORDER — FREESTYLE LIBRE 3 SENSOR MISC: 2.0000 | 11 refills | Status: AC

## 2021-09-10 MED ORDER — INSULIN LISPRO (1 UNIT DIAL) 100 UNIT/ML (KWIKPEN)
PEN_INJECTOR | SUBCUTANEOUS | 11 refills | Status: AC
Start: 2021-09-10 — End: ?

## 2021-09-10 NOTE — Telephone Encounter (Signed)
(  Key: [id number]) NovoLOG FlexPen 100UNIT/ML pen-injectors  I have sent PA but am worried where patient is out of medication. Please see additional documentation.

## 2021-09-10 NOTE — Telephone Encounter (Signed)
PATIENT WAS CALLED TO DISCUSS CHANGES NOVOLOG TO HUMALOG PER INSURANCE UNSURE WHY ON TRESIBA, NOVOLOG, AND 70/30 FOR TYPE 1 WILL WORK TO ADJUST REGIMEN AT F/U ON 10/01/21 LIBRE 3 CALLED IN

## 2021-09-10 NOTE — Telephone Encounter (Signed)
Pharmacists will call patient and change insulin.

## 2021-09-10 NOTE — Addendum Note (Signed)
Addended by: Lottie Dawson D on: 09/10/2021 11:42 AM   Modules accepted: Orders

## 2021-10-01 ENCOUNTER — Ambulatory Visit: Payer: Self-pay | Admitting: Family

## 2021-10-14 ENCOUNTER — Telehealth: Payer: Self-pay

## 2021-10-14 NOTE — Telephone Encounter (Signed)
Returned call left voice mail

## 2021-10-18 ENCOUNTER — Other Ambulatory Visit: Payer: Self-pay

## 2021-10-18 ENCOUNTER — Ambulatory Visit: Payer: PRIVATE HEALTH INSURANCE | Admitting: Nurse Practitioner

## 2021-11-19 ENCOUNTER — Other Ambulatory Visit: Payer: Self-pay | Admitting: Family

## 2021-11-26 ENCOUNTER — Ambulatory Visit: Payer: Self-pay | Admitting: Nurse Practitioner

## 2022-01-02 ENCOUNTER — Encounter: Payer: Self-pay | Admitting: Internal Medicine

## 2022-01-29 DIAGNOSIS — R7989 Other specified abnormal findings of blood chemistry: Secondary | ICD-10-CM | POA: Insufficient documentation

## 2022-01-29 DIAGNOSIS — E538 Deficiency of other specified B group vitamins: Secondary | ICD-10-CM | POA: Insufficient documentation

## 2022-02-04 ENCOUNTER — Encounter: Payer: Self-pay | Admitting: Internal Medicine

## 2022-02-04 ENCOUNTER — Ambulatory Visit (INDEPENDENT_AMBULATORY_CARE_PROVIDER_SITE_OTHER): Payer: PRIVATE HEALTH INSURANCE | Admitting: Internal Medicine

## 2022-02-04 ENCOUNTER — Encounter: Payer: Self-pay | Admitting: *Deleted

## 2022-02-04 ENCOUNTER — Telehealth: Payer: Self-pay | Admitting: *Deleted

## 2022-02-04 VITALS — BP 117/72 | HR 83 | Temp 97.3°F | Ht 70.0 in | Wt 161.6 lb

## 2022-02-04 DIAGNOSIS — Z1211 Encounter for screening for malignant neoplasm of colon: Secondary | ICD-10-CM

## 2022-02-04 DIAGNOSIS — K219 Gastro-esophageal reflux disease without esophagitis: Secondary | ICD-10-CM | POA: Diagnosis not present

## 2022-02-04 DIAGNOSIS — E10649 Type 1 diabetes mellitus with hypoglycemia without coma: Secondary | ICD-10-CM | POA: Diagnosis not present

## 2022-02-04 MED ORDER — PEG 3350-KCL-NA BICARB-NACL 420 G PO SOLR: ORAL | 0 refills | Status: AC

## 2022-02-04 NOTE — Progress Notes (Signed)
Primary Care Physician:  Sheela Stack Primary Gastroenterologist:  Dr. Jena Gauss  Pre-Procedure History & Physical: HPI:  [name] is a 51 y.o. male here for set up first-ever average risk screening colonoscopy. Slated to have 1 last year but insurance would not pay for it.  He turns 51 on 7/18 of this year.  No bowel symptoms such as rectal bleeding, etc.  No family history of colon cancer.  May have had a sigmoidoscopy 25 years ago.  Pepcid escalated to Protonix 40 mg daily for reflux last year - associated with essentially abolition of his reflux symptoms.  May have a little epigastric pain which lasted about 10 minutes once a month which he attributes to being dehydrated.  No nausea or vomiting.  Weight has been stable at 161.  Denies dysphagia.  No prior EGD. History of insulin-dependent diabetes mellitus and hypothyroidism. Past Medical History:  Diagnosis Date   Diabetes mellitus without complication (HCC)    Type 1   Hyperlipidemia    Hypothyroidism     No past surgical history on file.  Prior to Admission medications   Medication Sig Start Date End Date Taking? Authorizing Provider  Continuous Blood Gluc Sensor (FREESTYLE LIBRE 3 SENSOR) MISC 2 each by Does not apply route every 14 (fourteen) days. Place 1 sensor on the skin every 14 days. Use to check glucose continuously; DX E10.9 09/10/21  Yes Hawks, Christy A, FNP  famotidine (PEPCID) 20 MG tablet Take 1 tablet (20 mg total) by mouth daily. 08/16/21  Yes Hawks, Christy A, FNP  insulin lispro (HUMALOG KWIKPEN) 100 UNIT/ML KwikPen INJECT AS NEEDED WITH MEALS 3 TIMES DAILY FOR ELEVATED BG, OVER 200 - GIVE: 2 UNITS; 251-300- 3 UNITS; 301 OR OVER - 4 UNITS (Max daily dose 15 units) 09/10/21  Yes Hawks, Christy A, FNP  levothyroxine (SYNTHROID) 150 MCG tablet Take 1 tablet (150 mcg total) by mouth daily. 08/16/21 08/16/22 Yes Hawks, Christy A, FNP  pantoprazole (PROTONIX) 40 MG tablet TAKE 1 TABLET BY MOUTH ONCE  DAILY BEFORE BREAKFAST 08/21/21  Yes Clearance Coots, Kristen S, PA-C  insulin aspart (NOVOLOG FLEXPEN) 100 UNIT/ML FlexPen INJECT AS NEEDED FOR ELEVATED BG, OVER 200 - GIVE: 2 UNITS; 251-300- 3 UNITS; 301 OR OVER - 4 UNITS (Max daily dose 18 units) 09/09/21   Junie Spencer, FNP    Allergies as of 02/04/2022   (No Known Allergies)    Family History  Problem Relation Age of Onset   Muscular dystrophy Mother    Diabetes Father    Colon cancer Neg Hx     Social History   Socioeconomic History   Marital status: Married    Spouse name: Not on file   Number of children: Not on file   Years of education: Not on file   Highest education level: Not on file  Occupational History   Not on file  Tobacco Use   Smoking status: Never   Smokeless tobacco: Never  Vaping Use   Vaping Use: Never used  Substance and Sexual Activity   Alcohol use: No   Drug use: No   Sexual activity: Yes  Other Topics Concern   Not on file  Social History Narrative   Not on file   Social Determinants of Health   Financial Resource Strain: Not on file  Food Insecurity: Not on file  Transportation Needs: Not on file  Physical Activity: Not on file  Stress: Not on file  Social Connections: Not on  file  Intimate Partner Violence: Not on file    Review of Systems: See HPI, otherwise negative ROS  Physical Exam: BP 117/72 (BP Location: Left Arm, Patient Position: Sitting, Cuff Size: Normal)   Pulse 83   Temp (!) 97.3 F (36.3 C) (Temporal)   Ht 5\' 10"  (1.778 m)   Wt 161 lb 9.6 oz (73.3 kg)   SpO2 99%   BMI 23.19 kg/m  General:   Alert,  Well-developed, well-nourished, pleasant and cooperative in NAD Lungs:  Clear throughout to auscultation.   No wheezes, crackles, or rhonchi. No acute distress. Heart:  Regular rate and rhythm; no murmurs, clicks, rubs,  or gallops. Abdomen: Non-distended, normal bowel sounds.  Soft and nontender without appreciable mass or hepatosplenomegaly.  Pulses:  Normal pulses  noted. Extremities:  Without clubbing or edema.  Impression/Plan: 51 year old gentleman soon to turn 31 here to set up a first ever average risk screening colonoscopy.  This has been discussed with patient at length.  We will get it done.  GERD well controlled on Protonix without alarm features.  Fleeting epigastric pain about once monthly attributes to dehydration.  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  No alarm features at this time.  Doubt dyspepsia or biliary etiology.   May be musculoskeletal in origin.  Recommendations:  As discussed, we will proceed with screening colonoscopy-ASA 2.  The risks, benefits, limitations, alternatives and imponderables have been reviewed with the patient. Questions have been answered. All parties are agreeable.    Follow your sliding scale recommendations for insulin therapy during the prep.  Continue pantoprazole 40 mg daily 30 minutes before breakfast  GERD information provided  At this time, no need for an upper endoscopic evaluation  Further recommendations to follow after colonoscopy has been performed      Notice: This dictation was prepared with Dragon dictation along with smaller phrase technology. Any transcriptional errors that result from this process are unintentional and may not be corrected upon review.

## 2022-02-04 NOTE — Patient Instructions (Addendum)
It was nice meeting you today!  As discussed, we will proceed with screening colonoscopy-ASA 2  Follow your sliding scale recommendations for insulin therapy during the prep.  Continue pantoprazole 40 mg daily 30 minutes before breakfast  GERD information provided  At this time, no need for an upper endoscopic evaluation  Further recommendations to follow after colonoscopy has been performed

## 2022-02-04 NOTE — Telephone Encounter (Signed)
PA faxed to [facility] for TCS. Awaiting response.

## 2022-02-10 NOTE — Telephone Encounter (Signed)
Received fax back. PA denied. We are not in network with insurance. Patient will need to use an in network provider. Per fax patient can contact them to establish care with an in network facility and provider.   Called pt, LMOVM

## 2022-02-11 NOTE — Telephone Encounter (Signed)
Spoke with pt and is aware of denial. He wants to cancel procedure and will find a provider/facility that is in network. FYI to Dr. Jena Gauss

## 2022-03-20 ENCOUNTER — Ambulatory Visit (HOSPITAL_COMMUNITY): Admit: 2022-03-20 | Payer: PRIVATE HEALTH INSURANCE | Admitting: Internal Medicine

## 2022-03-20 ENCOUNTER — Encounter (HOSPITAL_COMMUNITY): Payer: Self-pay

## 2022-03-20 SURGERY — COLONOSCOPY WITH PROPOFOL
Anesthesia: Monitor Anesthesia Care

## 2022-03-29 ENCOUNTER — Other Ambulatory Visit: Payer: Self-pay | Admitting: Gastroenterology

## 2022-03-29 DIAGNOSIS — R6881 Early satiety: Secondary | ICD-10-CM

## 2022-03-29 DIAGNOSIS — K219 Gastro-esophageal reflux disease without esophagitis: Secondary | ICD-10-CM

## 2022-08-04 ENCOUNTER — Ambulatory Visit: Payer: PRIVATE HEALTH INSURANCE | Admitting: Urology

## 2022-08-17 ENCOUNTER — Other Ambulatory Visit: Payer: Self-pay | Admitting: Family

## 2022-08-17 DIAGNOSIS — E1065 Type 1 diabetes mellitus with hyperglycemia: Secondary | ICD-10-CM

## 2022-08-22 ENCOUNTER — Ambulatory Visit (INDEPENDENT_AMBULATORY_CARE_PROVIDER_SITE_OTHER): Payer: 59 | Admitting: Urology

## 2022-08-22 ENCOUNTER — Encounter: Payer: Self-pay | Admitting: Urology

## 2022-08-22 VITALS — BP 91/59 | HR 85

## 2022-08-22 DIAGNOSIS — N486 Induration penis plastica: Secondary | ICD-10-CM

## 2022-08-22 NOTE — Patient Instructions (Signed)
Collagenase Injection (Dupuytren Contracture/Peyronie Disease) What is this medication? COLLAGENASE (kohl LAH jen ace) treats conditions caused by thickening of tissue in your body. It works by breaking down excess collagen in the tissue, which reduces stiffness and tightness. This medicine may be used for other purposes; ask your health care provider or pharmacist if you have questions. COMMON BRAND NAME(S): Xiaflex What should I tell my care team before I take this medication? They need to know if you have any of these conditions: Bleeding disorder An unusual or allergic reaction to collagenase, other medications, foods, dyes, or preservatives Pregnant or trying to get pregnant Breast-feeding How should I use this medication? This medication is injected into the affected area. It is given by your care team in a hospital or clinic setting. A special MedGuide will be given to you by the pharmacist with each prescription and refill. Be sure to read this information carefully each time. Talk to your care team about the use of this medication in children. Special care may be needed. Overdosage: If you think you have taken too much of this medicine contact a poison control center or emergency room at once. NOTE: This medicine is only for you. Do not share this medicine with others. What if I miss a dose? Keep appointments for follow-up doses. It is important not to miss your dose. Call your care team if you are unable to keep an appointment. What may interact with this medication? Aspirin and aspirin-like medications Certain medications that treat or prevent blood clots, such as warfarin, enoxaparin, dalteparin, apixaban, dabigatran, rivaroxaban This list may not describe all possible interactions. Give your health care provider a list of all the medicines, herbs, non-prescription drugs, or dietary supplements you use. Also tell them if you smoke, drink alcohol, or use illegal drugs. Some items may  interact with your medicine. What should I watch for while using this medication? Your condition will be monitored carefully while you are receiving this medication. If medication is for Dupuytren's Contracture, visit your care team 1 to 3 days after the injection. Until you visit your care team, do not flex or extend the fingers of your hand that was injected. Do not touch your finger that was injected. Elevate your hand until bedtime. Do not perform activity with the injected hand until you are told that it is ok. Follow any instructions about wearing a splint or performing finger exercises. Contact your care team as soon as possible if you get increasing redness or swelling in the hand, have numbness or tingling in the treated finger, or have trouble bending the finger after the swelling goes down. If medication is for Peyronie's disease, do not have sex between the first and second injections. Wait 4 weeks after the second injection and when there is no more pain or swelling in the penis to have sex. Avoid using vacuum erection devices during treatment with this medication. Try to avoid straining stomach muscles such as during bowel movements. Your care team will give you instructions on how to perform modeling activities at home. Contact your care team as soon as possible if you have severe pain or swelling in the penis, severe purple bruising and swelling of the penis, trouble passing urine, blood in urine, popping or cracking sound form the penis, or sudden loss of ability to maintain an erection. What side effects may I notice from receiving this medication? Side effects that you should report to your care team as soon as possible: Allergic reactions--skin rash,   itching, hives, swelling of the face, lips, tongue, or throat Feeling faint or lightheaded Skin infection--skin redness, swelling, warmth, or pain Severe back pain, chest pain, headache, trouble breathing after injection Snap or pop that  you feel or hear, severe pain, numbness, swelling, or bruising of or trouble moving in area where injected Side effects that usually do not require medical attention (report to your care team if they continue or are bothersome): Pain, redness, or irritation at injection site This list may not describe all possible side effects. Call your doctor for medical advice about side effects. You may report side effects to FDA at 1-800-FDA-1088. Where should I keep my medication? This medication is given in a hospital or clinic. It will not be stored at home. NOTE: This sheet is a summary. It may not cover all possible information. If you have questions about this medicine, talk to your doctor, pharmacist, or health care provider.  2023 Elsevier/Gold Standard (2021-02-28 00:00:00)  

## 2022-08-22 NOTE — Progress Notes (Signed)
08/22/2022 9:30 AM   Marline Backbone 26-Dec-1970 [id number]  Referring provider: Rosalee Kaufman, PA-C [place],  [place] [address]  Penile curvature   HPI: Mr Stettler is a 52yo here for evaluation of penile curvature. In June 2023 he noted dorsal and right later curvature. He is able to palpate a knot since June 2023. No trauma. He has mild pain with erections. His partner has discomfort with intercourse. No issues urinating.   PMH: Past Medical History:  Diagnosis Date   Diabetes mellitus without complication ([facility])    Type 1   Hyperlipidemia    Hypothyroidism     Surgical History: No past surgical history on file.  Home Medications:  Allergies as of 08/22/2022   No Known Allergies      Medication List        Accurate as of August 22, 2022  9:30 AM. If you have any questions, ask your nurse or doctor.          Basaglar KwikPen 100 UNIT/ML SMARTSIG:20 Unit(s) SUB-Q Daily   famotidine 20 MG tablet Commonly known as: Pepcid Take 1 tablet (20 mg total) by mouth daily.   FreeStyle Libre 3 Sensor Misc 2 each by Does not apply route every 14 (fourteen) days. Place 1 sensor on the skin every 14 days. Use to check glucose continuously; DX E10.9   insulin lispro 100 UNIT/ML KwikPen Commonly known as: HumaLOG KwikPen INJECT AS NEEDED WITH MEALS 3 TIMES DAILY FOR ELEVATED BG, OVER 200 - GIVE: 2 UNITS; 251-300- 3 UNITS; 301 OR OVER - 4 UNITS (Max daily dose 15 units)   levothyroxine 150 MCG tablet Commonly known as: Synthroid Take 1 tablet (150 mcg total) by mouth daily.   pantoprazole 40 MG tablet Commonly known as: PROTONIX TAKE 1 TABLET BY MOUTH ONCE DAILY BEFORE BREAKFAST   polyethylene glycol-electrolytes 420 g solution Commonly known as: NuLYTELY As directed        Allergies: No Known Allergies  Family History: Family History  Problem Relation Age of Onset   Muscular dystrophy Mother    Diabetes Father    Colon cancer Neg Hx      Social History:  reports that he has never smoked. He has never used smokeless tobacco. He reports that he does not drink alcohol and does not use drugs.  ROS: All other review of systems were reviewed and are negative except what is noted above in HPI  Physical Exam: BP (!) 91/59   Pulse 85   Constitutional:  Alert and oriented, No acute distress. HEENT: [place] AT, moist mucus membranes.  Trachea midline, no masses. Cardiovascular: No clubbing, cyanosis, or edema. Respiratory: Normal respiratory effort, no increased work of breathing. GI: Abdomen is soft, nontender, nondistended, no abdominal masses GU: No CVA tenderness. Circumcised phallus. No masses/lesions on penis, testis, scrotum. Palpable 2cm dorsal peyronies plaque.  Lymph: No cervical or inguinal lymphadenopathy. Skin: No rashes, bruises or suspicious lesions. Neurologic: Grossly intact, no focal deficits, moving all 4 extremities. Psychiatric: Normal mood and affect.  Laboratory Data: Lab Results  Component Value Date   WBC 4.6 01/17/2021   HGB 10.6 (L) 01/17/2021   HCT 30.6 (L) 01/17/2021   MCV 113 (H) 01/17/2021   PLT 229 01/17/2021    Lab Results  Component Value Date   CREATININE 1.02 01/17/2021    No results found for: "PSA"  No results found for: "TESTOSTERONE"  Lab Results  Component Value Date   HGBA1C 8.0 (H) 01/17/2021  Urinalysis    Component Value Date/Time   COLORURINE YELLOW 08/03/2007 2241   APPEARANCEUR CLEAR 08/03/2007 2241   LABSPEC 1.010 08/03/2007 2241   PHURINE 6.0 08/03/2007 2241   GLUCOSEU NEGATIVE 08/03/2007 2241   HGBUR NEGATIVE 08/03/2007 2241   BILIRUBINUR NEGATIVE 08/03/2007 2241   KETONESUR NEGATIVE 08/03/2007 2241   PROTEINUR NEGATIVE 08/03/2007 2241   UROBILINOGEN 0.2 08/03/2007 2241   NITRITE NEGATIVE 08/03/2007 2241   LEUKOCYTESUR  08/03/2007 2241    NEGATIVE MICROSCOPIC NOT DONE ON URINES WITH NEGATIVE PROTEIN, BLOOD, LEUKOCYTES, NITRITE, OR GLUCOSE <1000  mg/dL.    Lab Results  Component Value Date   LABMICR 6.7 09/27/2018    Pertinent Imaging:  No results found for this or any previous visit.  No results found for this or any previous visit.  No results found for this or any previous visit.  No results found for this or any previous visit.  No results found for this or any previous visit.  No valid procedures specified. No results found for this or any previous visit.  No results found for this or any previous visit.   Assessment & Plan:    1. Peyronie disease We discussed the management of peyronies disease including medical therapy, penile plication, verapamil therapy and xiaflex therapy. After discussed the options the patient is undecided. I will have him return in a couple of weeks with a picture of his erection.     No follow-ups on file.  Nicolette Bang, MD  [facility] [place]

## 2022-08-23 ENCOUNTER — Other Ambulatory Visit: Payer: Self-pay | Admitting: Family

## 2022-08-23 DIAGNOSIS — E1065 Type 1 diabetes mellitus with hyperglycemia: Secondary | ICD-10-CM

## 2022-08-27 ENCOUNTER — Other Ambulatory Visit: Payer: Self-pay | Admitting: Family

## 2022-08-27 DIAGNOSIS — E1065 Type 1 diabetes mellitus with hyperglycemia: Secondary | ICD-10-CM

## 2022-09-12 ENCOUNTER — Ambulatory Visit (INDEPENDENT_AMBULATORY_CARE_PROVIDER_SITE_OTHER): Payer: 59 | Admitting: Urology

## 2022-09-12 VITALS — BP 93/58 | HR 84

## 2022-09-12 DIAGNOSIS — N486 Induration penis plastica: Secondary | ICD-10-CM

## 2022-09-12 NOTE — Progress Notes (Signed)
Patient rescheduled.

## 2022-09-18 ENCOUNTER — Ambulatory Visit: Payer: 59

## 2022-10-15 ENCOUNTER — Other Ambulatory Visit: Payer: Self-pay | Admitting: Gastroenterology

## 2022-10-15 DIAGNOSIS — R6881 Early satiety: Secondary | ICD-10-CM

## 2022-10-15 DIAGNOSIS — K219 Gastro-esophageal reflux disease without esophagitis: Secondary | ICD-10-CM

## 2023-06-16 ENCOUNTER — Ambulatory Visit: Payer: Managed Care, Other (non HMO) | Admitting: Neurology
